# Patient Record
Sex: Female | Born: 2000 | Race: White | Hispanic: No | Marital: Single | Smoking: Never smoker
Health system: Southern US, Community
[De-identification: ages and names within clinical notes are randomized; demographics above are authoritative.]

## PROBLEM LIST (undated history)

## (undated) ENCOUNTER — Inpatient Hospital Stay: Payer: Self-pay

## (undated) DIAGNOSIS — K219 Gastro-esophageal reflux disease without esophagitis: Secondary | ICD-10-CM

## (undated) DIAGNOSIS — B2790 Infectious mononucleosis, unspecified without complication: Secondary | ICD-10-CM

## (undated) DIAGNOSIS — T7840XA Allergy, unspecified, initial encounter: Secondary | ICD-10-CM

## (undated) HISTORY — PX: NO PAST SURGERIES: SHX2092

## (undated) HISTORY — DX: Infectious mononucleosis, unspecified without complication: B27.90

## (undated) HISTORY — DX: Allergy, unspecified, initial encounter: T78.40XA

## (undated) HISTORY — DX: Gastro-esophageal reflux disease without esophagitis: K21.9

## (undated) HISTORY — PX: WISDOM TOOTH EXTRACTION: SHX21

---
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
## 2021-04-23 ENCOUNTER — Ambulatory Visit: Payer: No Typology Code available for payment source | Admitting: Family Medicine

## 2021-04-23 ENCOUNTER — Encounter: Payer: Self-pay | Admitting: Family Medicine

## 2021-04-23 ENCOUNTER — Other Ambulatory Visit: Payer: Self-pay

## 2021-04-23 VITALS — BP 123/86 | HR 88 | Temp 98.5°F | Wt 143.2 lb

## 2021-04-23 DIAGNOSIS — N3001 Acute cystitis with hematuria: Secondary | ICD-10-CM | POA: Diagnosis not present

## 2021-04-23 DIAGNOSIS — S134XXA Sprain of ligaments of cervical spine, initial encounter: Secondary | ICD-10-CM

## 2021-04-23 DIAGNOSIS — R300 Dysuria: Secondary | ICD-10-CM

## 2021-04-23 LAB — URINALYSIS, ROUTINE W REFLEX MICROSCOPIC
Bilirubin, UA: NEGATIVE
Glucose, UA: NEGATIVE
Ketones, UA: NEGATIVE
Nitrite, UA: POSITIVE — AB
Specific Gravity, UA: 1.02 (ref 1.005–1.030)
Urobilinogen, Ur: 0.2 mg/dL (ref 0.2–1.0)
pH, UA: 8 — ABNORMAL HIGH (ref 5.0–7.5)

## 2021-04-23 LAB — MICROSCOPIC EXAMINATION

## 2021-04-23 MED ORDER — CYCLOBENZAPRINE HCL 10 MG PO TABS: 10.0000 mg | ORAL_TABLET | Freq: Every day | ORAL | 0 refills | Status: DC

## 2021-04-23 MED ORDER — CIPROFLOXACIN HCL 500 MG PO TABS: 500.0000 mg | ORAL_TABLET | Freq: Two times a day (BID) | ORAL | 0 refills | Status: DC
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
## 2021-04-25 LAB — GC/CHLAMYDIA PROBE AMP
Chlamydia trachomatis, NAA: NEGATIVE
Neisseria Gonorrhoeae by PCR: NEGATIVE

## 2021-04-26 LAB — URINE CULTURE

## 2021-06-05 ENCOUNTER — Ambulatory Visit (INDEPENDENT_AMBULATORY_CARE_PROVIDER_SITE_OTHER): Payer: No Typology Code available for payment source | Admitting: Unknown Physician Specialty

## 2021-06-05 ENCOUNTER — Other Ambulatory Visit: Payer: Self-pay

## 2021-06-05 ENCOUNTER — Encounter: Payer: Self-pay | Admitting: Unknown Physician Specialty

## 2021-06-05 VITALS — BP 136/77 | HR 95 | Temp 98.7°F | Wt 144.4 lb

## 2021-06-05 DIAGNOSIS — N912 Amenorrhea, unspecified: Secondary | ICD-10-CM

## 2021-06-05 DIAGNOSIS — Z3201 Encounter for pregnancy test, result positive: Secondary | ICD-10-CM | POA: Diagnosis not present

## 2021-06-05 LAB — PREGNANCY, URINE: Preg Test, Ur: POSITIVE — AB

## 2021-06-05 NOTE — Progress Notes (Signed)
? ?BP 136/77   Pulse 95   Temp 98.7 ?F (37.1 ?C) (Oral)   Wt 144 lb 6.4 oz (65.5 kg)   LMP 02/26/2021 (Approximate)   SpO2 98%   BMI 24.03 kg/m?   ? ?Subjective:  ? ? Patient ID: Faith [name], female    DOB: April 07, 2000, 21 y.o.   MRN: [id number] ? ?HPI: ?Faith [name] is a 21 y.o. female ? ?Chief Complaint  ?Patient presents with  ? Amenorrhea  ?  Pt states she has not hade a period since December. States she took a home pregnancy test yesterday that showed positive.   ? ?Pt is here to confirm a home pregnancy test.  LMP was in December, but notes she has irregular periods.  Pt is having nausea in the AM.   ? ?Relevant past medical, surgical, family and social history reviewed and updated as indicated. Interim medical history since our last visit reviewed. ?Allergies and medications reviewed and updated. ? ?Review of Systems  ?Constitutional: Negative.   ?HENT: Negative.    ?Respiratory: Negative.    ?Cardiovascular: Negative.   ?Genitourinary: Negative.   ?Neurological: Negative.   ? ?Per HPI unless specifically indicated above ? ?   ?Objective:  ?  ?BP 136/77   Pulse 95   Temp 98.7 ?F (37.1 ?C) (Oral)   Wt 144 lb 6.4 oz (65.5 kg)   LMP 02/26/2021 (Approximate)   SpO2 98%   BMI 24.03 kg/m?   ?Wt Readings from Last 3 Encounters:  ?06/05/21 144 lb 6.4 oz (65.5 kg) (75 %, Z= 0.68)*  ?04/23/21 143 lb 3.2 oz (65 kg) (74 %, Z= 0.65)*  ?09/14/20 127 lb 6.4 oz (57.8 kg) (53 %, Z= 0.08)*  ? ?* Growth percentiles are based on CDC (Girls, 2-20 Years) data.  ?  ?Physical Exam ?Constitutional:   ?   General: She is not in acute distress. ?   Appearance: Normal appearance. She is well-developed.  ?HENT:  ?   Head: Normocephalic and atraumatic.  ?Eyes:  ?   General: Lids are normal. No scleral icterus.    ?   Right eye: No discharge.     ?   Left eye: No discharge.  ?   Conjunctiva/sclera: Conjunctivae normal.  ?Cardiovascular:  ?   Rate and Rhythm: Normal rate.  ?Pulmonary:  ?   Effort: Pulmonary effort is normal.   ?Abdominal:  ?   Palpations: There is no hepatomegaly or splenomegaly.  ?Musculoskeletal:     ?   General: Normal range of motion.  ?Skin: ?   Coloration: Skin is not pale.  ?   Findings: No rash.  ?Neurological:  ?   Mental Status: She is alert and oriented to person, place, and time.  ?Psychiatric:     ?   Behavior: Behavior normal.     ?   Thought Content: Thought content normal.     ?   Judgment: Judgment normal.  ? ? ?Results for orders placed or performed in visit on 04/23/21  ?GC/Chlamydia Probe Amp  ? Specimen: Urine  ? UR  ?Result Value Ref Range  ? Chlamydia trachomatis, NAA Negative Negative  ? Neisseria Gonorrhoeae by PCR Negative Negative  ?Microscopic Examination  ? Urine  ?Result Value Ref Range  ? WBC, UA 11-30 (A) 0 - 5 /hpf  ? RBC 0-2 0 - 2 /hpf  ? Epithelial Cells (non renal) 0-10 0 - 10 /hpf  ? Mucus, UA Present (A) Not Estab.  ? Bacteria, UA Many (A)  None seen/Few  ?Urine Culture  ? Specimen: Urine  ? UR  ?Result Value Ref Range  ? Urine Culture, Routine Final report (A)   ? Organism ID, Bacteria Escherichia coli (A)   ? Antimicrobial Susceptibility Comment   ?Urinalysis, Routine w reflex microscopic  ?Result Value Ref Range  ? Specific Gravity, UA 1.020 1.005 - 1.030  ? pH, UA 8.0 (H) 5.0 - 7.5  ? Color, UA Yellow Yellow  ? Appearance Ur Cloudy (A) Clear  ? Leukocytes,UA 3+ (A) Negative  ? Protein,UA 1+ (A) Negative/Trace  ? Glucose, UA Negative Negative  ? Ketones, UA Negative Negative  ? RBC, UA Trace (A) Negative  ? Bilirubin, UA Negative Negative  ? Urobilinogen, Ur 0.2 0.2 - 1.0 mg/dL  ? Nitrite, UA Positive (A) Negative  ? Microscopic Examination See below:   ? ?   ?Assessment & Plan:  ? ?Problem List Items Addressed This Visit   ?None ?Visit Diagnoses   ? ? Amenorrhea    -  Primary  ? Relevant Orders  ? Pregnancy, urine  ? Ambulatory referral to Obstetrics / Gynecology  ? Positive pregnancy test      ? 12 weeks.  Due date September 27.  Take a maternal multi vitamin.  Ginger for nausea.   Refer to gyn.  Avoid OTC meds  ? Relevant Orders  ? Ambulatory referral to Obstetrics / Gynecology  ? ?  ?  ? ?Follow up plan: ?F/u with gyn ? ? ? ? ? ?

## 2021-06-18 LAB — HM HIV SCREENING LAB: HM HIV Screening: NEGATIVE

## 2021-06-18 LAB — HIV ANTIBODY (ROUTINE TESTING W REFLEX): HIV 1&2 Ab, 4th Generation: NEGATIVE

## 2021-06-18 LAB — OB RESULTS CONSOLE GC/CHLAMYDIA: Chlamydia: NEGATIVE

## 2021-06-18 LAB — HM HEPATITIS C SCREENING LAB: HM Hepatitis Screen: NEGATIVE

## 2021-09-10 ENCOUNTER — Ambulatory Visit
Admission: EM | Admit: 2021-09-10 | Discharge: 2021-09-10 | Disposition: A | Discharge status: Home / Self Care | Payer: No Typology Code available for payment source

## 2021-09-10 DIAGNOSIS — L559 Sunburn, unspecified: Secondary | ICD-10-CM

## 2021-09-10 NOTE — ED Triage Notes (Signed)
Patient present top UC for sunburn -- 30 hours.   Patient is pregnant and the sunburn is causing her to have chills.

## 2021-09-15 ENCOUNTER — Other Ambulatory Visit: Payer: Self-pay | Admitting: Family Medicine

## 2021-09-17 ENCOUNTER — Ambulatory Visit (INDEPENDENT_AMBULATORY_CARE_PROVIDER_SITE_OTHER): Payer: No Typology Code available for payment source | Admitting: Family Medicine

## 2021-09-17 ENCOUNTER — Encounter: Payer: Self-pay | Admitting: Family Medicine

## 2021-09-17 VITALS — BP 128/83 | HR 95 | Temp 97.7°F | Ht 64.02 in | Wt 155.8 lb

## 2021-09-17 DIAGNOSIS — K219 Gastro-esophageal reflux disease without esophagitis: Secondary | ICD-10-CM | POA: Diagnosis not present

## 2021-09-17 DIAGNOSIS — Z0000 Encounter for general adult medical examination without abnormal findings: Secondary | ICD-10-CM

## 2021-09-17 NOTE — Progress Notes (Signed)
BP 128/83   Pulse 95   Temp 97.7 F (36.5 C) (Oral)   Ht 5' 4.02" (1.626 m)   Wt 155 lb 12.8 oz (70.7 kg)   LMP 02/26/2021 (Approximate)   SpO2 100%   BMI 26.73 kg/m    Subjective:    Patient ID: Faith [name], female    DOB: May 14, 2000, 21 y.o.   MRN: [id number]  HPI: Karinda Cabriales is a 21 y.o. female presenting on 09/17/2021 for comprehensive medical examination. Current medical complaints include:currently pregnant and having issues with GERD.  Menopausal Symptoms: no  Depression Screen done today and results listed below:     09/17/2021    1:14 PM 04/23/2021    3:06 PM 09/14/2020    9:06 AM 03/06/2020    8:46 AM 01/28/2018    9:26 AM  Depression screen PHQ 2/9  Decreased Interest 1 1 0 0 0  Down, Depressed, Hopeless 0 1 0 0 0  PHQ - 2 Score 1 2 0 0 0  Altered sleeping 1 1   0  Tired, decreased energy 2 1   0  Change in appetite 2 2   0  Feeling bad or failure about yourself  1 0   0  Trouble concentrating 0 1   0  Moving slowly or fidgety/restless 0 1   0  Suicidal thoughts 0    0  PHQ-9 Score 7 8   0  Difficult doing work/chores Somewhat difficult    Not difficult at all    Past Medical History:  Past Medical History:  Diagnosis Date   Allergy    Mononucleosis 21yo    Surgical History:  Past Surgical History:  Procedure Laterality Date   NO PAST SURGERIES      Medications:  Current Outpatient Medications on File Prior to Visit  Medication Sig   cetirizine (ZYRTEC) 10 MG tablet Take 1 tablet (10 mg total) by mouth daily.   ondansetron (ZOFRAN ODT) 8 MG disintegrating tablet Take 1 tablet (8 mg total) by mouth every 8 (eight) hours as needed.   Prenatal Vit-Fe Fumarate-FA (PRENATAL 1+1 PO) Take 1 tablet by mouth daily.   No current facility-administered medications on file prior to visit.    Allergies:  Allergies  Allergen Reactions   Orange Fruit [Citrus] Anaphylaxis    Social History:  Social History   Socioeconomic History   Marital  status: Single    Spouse name: Not on file   Number of children: Not on file   Years of education: Not on file   Highest education level: Not on file  Occupational History   Not on file  Tobacco Use   Smoking status: Never   Smokeless tobacco: Never  Vaping Use   Vaping Use: Some days  Substance and Sexual Activity   Alcohol use: No    Alcohol/week: 0.0 standard drinks of alcohol   Drug use: No   Sexual activity: Yes    Birth control/protection: Condom  Other Topics Concern   Not on file  Social History Narrative   Not on file   Social Determinants of Health   Financial Resource Strain: Not on file  Food Insecurity: Not on file  Transportation Needs: Not on file  Physical Activity: Not on file  Stress: Not on file  Social Connections: Not on file  Intimate Partner Violence: Not on file   Social History   Tobacco Use  Smoking Status Never  Smokeless Tobacco Never   Social History  Substance and Sexual Activity  Alcohol Use No   Alcohol/week: 0.0 standard drinks of alcohol    Family History:  Family History  Problem Relation Age of Onset   Cancer Mother        Hodgkin's Lymphoma   Hypertension Maternal Grandmother    Hypertension Paternal Grandmother    Cancer Paternal Grandmother    Cancer Paternal Grandfather        Lung    Past medical history, surgical history, medications, allergies, family history and social history reviewed with patient today and changes made to appropriate areas of the chart.   Review of Systems  Constitutional: Negative.   HENT: Negative.    Eyes: Negative.   Respiratory: Negative.    Cardiovascular: Negative.   Gastrointestinal:  Positive for abdominal pain, heartburn, nausea and vomiting. Negative for blood in stool, constipation, diarrhea and melena.  Genitourinary: Negative.   Musculoskeletal: Negative.   Skin: Negative.   Neurological: Negative.   Endo/Heme/Allergies:  Positive for environmental allergies and  polydipsia. Does not bruise/bleed easily.  Psychiatric/Behavioral: Negative.     All other ROS negative except what is listed above and in the HPI.      Objective:    BP 128/83   Pulse 95   Temp 97.7 F (36.5 C) (Oral)   Ht 5' 4.02" (1.626 m)   Wt 155 lb 12.8 oz (70.7 kg)   LMP 02/26/2021 (Approximate)   SpO2 100%   BMI 26.73 kg/m   Wt Readings from Last 3 Encounters:  09/17/21 155 lb 12.8 oz (70.7 kg) (85 %, Z= 1.02)*  09/10/21 154 lb (69.9 kg) (83 %, Z= 0.97)*  06/05/21 144 lb 6.4 oz (65.5 kg) (75 %, Z= 0.68)*   * Growth percentiles are based on CDC (Girls, 2-20 Years) data.    Physical Exam Vitals and nursing note reviewed.  Constitutional:      General: She is not in acute distress.    Appearance: Normal appearance. She is not ill-appearing, toxic-appearing or diaphoretic.  HENT:     Head: Normocephalic and atraumatic.     Right Ear: Tympanic membrane, ear canal and external ear normal. There is no impacted cerumen.     Left Ear: Tympanic membrane, ear canal and external ear normal. There is no impacted cerumen.     Nose: Nose normal. No congestion or rhinorrhea.     Mouth/Throat:     Mouth: Mucous membranes are moist.     Pharynx: Oropharynx is clear. No oropharyngeal exudate or posterior oropharyngeal erythema.  Eyes:     General: No scleral icterus.       Right eye: No discharge.        Left eye: No discharge.     Extraocular Movements: Extraocular movements intact.     Conjunctiva/sclera: Conjunctivae normal.     Pupils: Pupils are equal, round, and reactive to light.  Neck:     Vascular: No carotid bruit.  Cardiovascular:     Rate and Rhythm: Normal rate and regular rhythm.     Pulses: Normal pulses.     Heart sounds: No murmur heard.    No friction rub. No gallop.  Pulmonary:     Effort: Pulmonary effort is normal. No respiratory distress.     Breath sounds: Normal breath sounds. No stridor. No wheezing, rhonchi or rales.  Chest:     Chest wall: No  tenderness.  Abdominal:     General: Bowel sounds are normal. There is no distension.  Palpations: Abdomen is soft. There is no mass.     Tenderness: There is no abdominal tenderness. There is no right CVA tenderness, left CVA tenderness, guarding or rebound.     Hernia: No hernia is present.     Comments: Gravid  Genitourinary:    Comments: Breast and pelvic exams deferred with shared decision making Musculoskeletal:        General: No swelling, tenderness, deformity or signs of injury.     Cervical back: Normal range of motion and neck supple. No rigidity. No muscular tenderness.     Right lower leg: No edema.     Left lower leg: No edema.  Lymphadenopathy:     Cervical: No cervical adenopathy.  Skin:    General: Skin is warm and dry.     Capillary Refill: Capillary refill takes less than 2 seconds.     Coloration: Skin is not jaundiced or pale.     Findings: No bruising, erythema, lesion or rash.  Neurological:     General: No focal deficit present.     Mental Status: She is alert and oriented to person, place, and time. Mental status is at baseline.     Cranial Nerves: No cranial nerve deficit.     Sensory: No sensory deficit.     Motor: No weakness.     Coordination: Coordination normal.     Gait: Gait normal.     Deep Tendon Reflexes: Reflexes normal.  Psychiatric:        Mood and Affect: Mood normal.        Behavior: Behavior normal.        Thought Content: Thought content normal.        Judgment: Judgment normal.     Results for orders placed or performed in visit on 09/17/21  OB RESULTS CONSOLE GC/Chlamydia  Result Value Ref Range   Chlamydia Negative   HIV Antibody (routine testing w rflx)  Result Value Ref Range   HIV 1&2 Ab, 4th Generation negative   HM HIV SCREENING LAB  Result Value Ref Range   HM HIV Screening Negative - Validated   HM HEPATITIS C SCREENING LAB  Result Value Ref Range   HM Hepatitis Screen Negative-Validated       Assessment &  Plan:   Problem List Items Addressed This Visit   None Visit Diagnoses     Routine general medical examination at a health care facility    -  Primary   Vaccines up to date. Will hold on labs today given pregnancy. Continue diet and exercise. Call with any concerns. Continue to monitor.   Gastroesophageal reflux disease, unspecified whether esophagitis present       Information about GERD in pregnency given. Will work on dietary changes. Call with any concerns.         Follow up plan: Return in about 6 months (around 03/20/2022).   LABORATORY TESTING:  - Pap smear: not applicable  IMMUNIZATIONS:   - Tdap: Tetanus vaccination status reviewed: will get through GYN. - Influenza: Postponed to flu season - Pneumovax: Not applicable - Prevnar: Not applicable - COVID: Up to date  PATIENT COUNSELING:   Advised to take 1 mg of folate supplement per day if capable of pregnancy.   Sexuality: Discussed sexually transmitted diseases, partner selection, use of condoms, avoidance of unintended pregnancy  and contraceptive alternatives.   Advised to avoid cigarette smoking.  I discussed with the patient that most people either abstain from alcohol or drink within  safe limits (<=14/week and <=4 drinks/occasion for males, <=7/weeks and <= 3 drinks/occasion for females) and that the risk for alcohol disorders and other health effects rises proportionally with the number of drinks per week and how often a drinker exceeds daily limits.  Discussed cessation/primary prevention of drug use and availability of treatment for abuse.   Diet: Encouraged to adjust caloric intake to maintain  or achieve ideal body weight, to reduce intake of dietary saturated fat and total fat, to limit sodium intake by avoiding high sodium foods and not adding table salt, and to maintain adequate dietary potassium and calcium preferably from fresh fruits, vegetables, and low-fat dairy products.    stressed the importance of  regular exercise  Injury prevention: Discussed safety belts, safety helmets, smoke detector, smoking near bedding or upholstery.   Dental health: Discussed importance of regular tooth brushing, flossing, and dental visits.    NEXT PREVENTATIVE PHYSICAL DUE IN 1 YEAR. Return in about 6 months (around 03/20/2022).

## 2021-09-17 NOTE — Telephone Encounter (Signed)
Requested medication (s) are due for refill today:   Provider to review  Requested medication (s) are on the active medication list:   No  Future visit scheduled:   Yes   Was seen today by Dr. Laural Benes   Last ordered: Discontinued 09/17/2021  Non delegated refill    Requested Prescriptions  Pending Prescriptions Disp Refills   cyclobenzaprine (FLEXERIL) 10 MG tablet [Pharmacy Med Name: CYCLOBENZAPRINE 10 MG TABLET] 30 tablet 0    Sig: TAKE 1 TABLET BY MOUTH EVERYDAY AT BEDTIME     Not Delegated - Analgesics:  Muscle Relaxants Failed - 09/15/2021  5:23 PM      Failed - This refill cannot be delegated      Passed - Valid encounter within last 6 months    Recent Outpatient Visits           Today Routine general medical examination at a health care facility   [facility], Megan P, DO   3 months ago Amenorrhea   [facility] Gabriel Cirri, NP   4 months ago Acute cystitis with hematuria   [facility] [place], Megan P, DO   1 year ago Routine general medical examination at a health care facility   [facility] [place], [place], DO   1 year ago Vertigo   [facility] Caro Laroche, DO       Future Appointments             In 6 months Laural Benes, Oralia Rud, DO [facility]

## 2021-11-25 DIAGNOSIS — O1490 Unspecified pre-eclampsia, unspecified trimester: Secondary | ICD-10-CM

## 2022-03-21 ENCOUNTER — Ambulatory Visit (INDEPENDENT_AMBULATORY_CARE_PROVIDER_SITE_OTHER): Payer: BC Managed Care – PPO | Admitting: Family Medicine

## 2022-03-21 ENCOUNTER — Encounter: Payer: Self-pay | Admitting: Family Medicine

## 2022-03-21 VITALS — BP 110/76 | HR 79 | Temp 98.1°F | Ht 64.0 in | Wt 150.0 lb

## 2022-03-21 DIAGNOSIS — Z8759 Personal history of other complications of pregnancy, childbirth and the puerperium: Secondary | ICD-10-CM | POA: Diagnosis not present

## 2022-03-21 DIAGNOSIS — K649 Unspecified hemorrhoids: Secondary | ICD-10-CM

## 2022-03-21 DIAGNOSIS — G43709 Chronic migraine without aura, not intractable, without status migrainosus: Secondary | ICD-10-CM

## 2022-03-21 MED ORDER — HYDROCORTISONE ACETATE 25 MG RE SUPP: 25.0000 mg | Freq: Two times a day (BID) | RECTAL | 6 refills | Status: DC

## 2022-03-21 NOTE — Progress Notes (Signed)
BP 110/76   Pulse 79   Temp 98.1 F (36.7 C) (Oral)   Ht 5\' 4"  (1.626 m)   Wt 150 lb (68 kg)   SpO2 98%   Breastfeeding Unknown   BMI 25.75 kg/m    Subjective:    Patient ID: Faith [name], female    DOB: October 24, 2000, 22 y.o.   MRN: [id number]  HPI: Faith [name] is a 22 y.o. female  Chief Complaint  Patient presents with   Migraine   Has been doing well off medicine for migraines. No concerns.   HEMORRHOIDS Duration: months Anal fullness: yes Perianal itching/irritation: yes Perianal pain: no Bright red rectal bleeding: yes Amount of blood: spotting Frequency: intermittent Constipation: no Hard stools: no Chronic straining/valsava: no Status: stable Treatments attempted: OTC medicated wipes  Previous hemorrhoids: no  Colonoscopy: no  Relevant past medical, surgical, family and social history reviewed and updated as indicated. Interim medical history since our last visit reviewed. Allergies and medications reviewed and updated.  Review of Systems  Constitutional: Negative.   Respiratory: Negative.    Cardiovascular: Negative.   Gastrointestinal: Negative.   Musculoskeletal: Negative.   Neurological: Negative.   Psychiatric/Behavioral: Negative.      Per HPI unless specifically indicated above     Objective:    BP 110/76   Pulse 79   Temp 98.1 F (36.7 C) (Oral)   Ht 5\' 4"  (1.626 m)   Wt 150 lb (68 kg)   SpO2 98%   Breastfeeding Unknown   BMI 25.75 kg/m   Wt Readings from Last 3 Encounters:  03/21/22 150 lb (68 kg)  09/17/21 155 lb 12.8 oz (70.7 kg) (85 %, Z= 1.02)*  09/10/21 154 lb (69.9 kg) (83 %, Z= 0.97)*   * Growth percentiles are based on CDC (Girls, 2-20 Years) data.    Physical Exam Vitals and nursing note reviewed.  Constitutional:      General: She is not in acute distress.    Appearance: Normal appearance. She is normal weight. She is not ill-appearing, toxic-appearing or diaphoretic.  HENT:     Head: Normocephalic and  atraumatic.     Right Ear: External ear normal.     Left Ear: External ear normal.     Nose: Nose normal.     Mouth/Throat:     Mouth: Mucous membranes are moist.     Pharynx: Oropharynx is clear.  Eyes:     General: No scleral icterus.       Right eye: No discharge.        Left eye: No discharge.     Extraocular Movements: Extraocular movements intact.     Conjunctiva/sclera: Conjunctivae normal.     Pupils: Pupils are equal, round, and reactive to light.  Cardiovascular:     Rate and Rhythm: Normal rate and regular rhythm.     Pulses: Normal pulses.     Heart sounds: Normal heart sounds. No murmur heard.    No friction rub. No gallop.  Pulmonary:     Effort: Pulmonary effort is normal. No respiratory distress.     Breath sounds: Normal breath sounds. No stridor. No wheezing, rhonchi or rales.  Chest:     Chest wall: No tenderness.  Musculoskeletal:        General: Normal range of motion.     Cervical back: Normal range of motion and neck supple.  Skin:    General: Skin is warm and dry.     Capillary Refill: Capillary refill takes less  than 2 seconds.     Coloration: Skin is not jaundiced or pale.     Findings: No bruising, erythema, lesion or rash.  Neurological:     General: No focal deficit present.     Mental Status: She is alert and oriented to person, place, and time. Mental status is at baseline.  Psychiatric:        Mood and Affect: Mood normal.        Behavior: Behavior normal.        Thought Content: Thought content normal.        Judgment: Judgment normal.     Results for orders placed or performed in visit on 09/17/21  OB RESULTS CONSOLE GC/Chlamydia  Result Value Ref Range   Chlamydia Negative   HIV Antibody (routine testing w rflx)  Result Value Ref Range   HIV 1&2 Ab, 4th Generation negative   HM HIV SCREENING LAB  Result Value Ref Range   HM HIV Screening Negative - Validated   HM HEPATITIS C SCREENING LAB  Result Value Ref Range   HM Hepatitis  Screen Negative-Validated       Assessment & Plan:   Problem List Items Addressed This Visit       Cardiovascular and Mediastinum   Chronic migraine without aura without status migrainosus, not intractable    Doing well off medicine. Continue to monitor. Call with any concerns.         Other   History of pre-eclampsia    BP under good control. Continue to monitor. Call with any concerns.      Other Visit Diagnoses     Hemorrhoids, unspecified hemorrhoid type    -  Primary   Will treat with anusol. Call if not getting better or getting worse.        Follow up plan: Return in about 6 months (around 09/19/2022) for physical.

## 2022-03-21 NOTE — Assessment & Plan Note (Signed)
Doing well off medicine. Continue to monitor. Call with any concerns.  

## 2022-03-21 NOTE — Assessment & Plan Note (Signed)
BP under good control. Continue to monitor. Call with any concerns.  

## 2022-04-10 ENCOUNTER — Encounter: Payer: Self-pay | Admitting: Family Medicine

## 2022-05-11 DIAGNOSIS — R1110 Vomiting, unspecified: Secondary | ICD-10-CM | POA: Diagnosis not present

## 2022-05-12 DIAGNOSIS — R000 Tachycardia, unspecified: Secondary | ICD-10-CM | POA: Diagnosis not present

## 2022-05-14 ENCOUNTER — Telehealth: Payer: Self-pay

## 2022-05-14 NOTE — Transitions of Care (Post Inpatient/ED Visit) (Signed)
   05/14/2022  Name: Faith [name] MRN: [id number] DOB: May 05, 2000  Today's TOC FU Call Status: Today's TOC FU Call Status:: Successful TOC FU Call Competed TOC FU Call Complete Date: 05/14/22  Transition Care Management Follow-up Telephone Call Date of Discharge: 05/11/22 Discharge Facility: Other ([place]) Name of Other ([place]) Discharge Facility: Patient says she left before being seen as she waited for 3 hours. Type of Discharge: Emergency Department Reason for ED Visit: Other: How have you been since you were released from the hospital?: Better Any questions or concerns?: No  Items Reviewed: Did you receive and understand the discharge instructions provided?: No Medications obtained and verified?: No Any new allergies since your discharge?: No Dietary orders reviewed?: NA Do you have support at home?: No  Home Care and Equipment/Supplies: [place] Ordered?: No Any new equipment or medical supplies ordered?: No  Functional Questionnaire: Do you need assistance with bathing/showering or dressing?: No Do you need assistance with meal preparation?: No Do you need assistance with eating?: No Do you have difficulty maintaining continence: No Do you need assistance with getting out of bed/getting out of a chair/moving?: No Do you have difficulty managing or taking your medications?: No  Folllow up appointments reviewed: PCP Follow-up appointment confirmed?: No MD Provider Line Number:[phone] Given: Yes [facility] Follow-up appointment confirmed?: NA Do you need transportation to your follow-up appointment?: No Do you understand care options if your condition(s) worsen?: Yes-patient verbalized understanding    [place], [place]

## 2022-05-14 NOTE — Telephone Encounter (Signed)
-----   Message from Georgina Peer, [place] sent at 05/14/2022  9:29 AM EST ----- Needs TOC call completed please.

## 2022-06-11 ENCOUNTER — Ambulatory Visit: Payer: BC Managed Care – PPO | Admitting: Unknown Physician Specialty

## 2022-06-11 ENCOUNTER — Encounter: Payer: Self-pay | Admitting: Unknown Physician Specialty

## 2022-06-11 ENCOUNTER — Emergency Department
Admission: EM | Admit: 2022-06-11 | Discharge: 2022-06-11 | Disposition: A | Discharge status: Home / Self Care | Payer: BC Managed Care – PPO | Attending: Student in an Organized Health Care Education/Training Program | Admitting: Student in an Organized Health Care Education/Training Program

## 2022-06-11 VITALS — BP 123/75 | HR 76 | Temp 99.6°F | Wt 148.2 lb

## 2022-06-11 DIAGNOSIS — R1111 Vomiting without nausea: Secondary | ICD-10-CM | POA: Diagnosis not present

## 2022-06-11 DIAGNOSIS — R0602 Shortness of breath: Secondary | ICD-10-CM | POA: Diagnosis not present

## 2022-06-11 DIAGNOSIS — T7840XA Allergy, unspecified, initial encounter: Secondary | ICD-10-CM | POA: Diagnosis not present

## 2022-06-11 DIAGNOSIS — Z3009 Encounter for other general counseling and advice on contraception: Secondary | ICD-10-CM

## 2022-06-11 DIAGNOSIS — T782XXA Anaphylactic shock, unspecified, initial encounter: Secondary | ICD-10-CM | POA: Diagnosis not present

## 2022-06-11 DIAGNOSIS — R21 Rash and other nonspecific skin eruption: Secondary | ICD-10-CM | POA: Diagnosis not present

## 2022-06-11 LAB — URINALYSIS, ROUTINE W REFLEX MICROSCOPIC
Bacteria, UA: NONE SEEN
Bilirubin Urine: NEGATIVE
Glucose, UA: NEGATIVE mg/dL
Ketones, ur: NEGATIVE mg/dL
Leukocytes,Ua: NEGATIVE
Nitrite: NEGATIVE
Protein, ur: 30 mg/dL — AB
Specific Gravity, Urine: 1.009 (ref 1.005–1.030)
pH: 6 (ref 5.0–8.0)

## 2022-06-11 LAB — COMPREHENSIVE METABOLIC PANEL
ALT: 20 U/L (ref 0–44)
AST: 22 U/L (ref 15–41)
Albumin: 3.6 g/dL (ref 3.5–5.0)
Alkaline Phosphatase: 78 U/L (ref 38–126)
Anion gap: 13 (ref 5–15)
BUN: 11 mg/dL (ref 6–20)
CO2: 23 mmol/L (ref 22–32)
Calcium: 8.5 mg/dL — ABNORMAL LOW (ref 8.9–10.3)
Chloride: 104 mmol/L (ref 98–111)
Creatinine, Ser: 0.66 mg/dL (ref 0.44–1.00)
GFR, Estimated: >60 mL/min (ref >60)
Glucose, Bld: 105 mg/dL — ABNORMAL HIGH (ref 70–99)
Potassium: 3 mmol/L — ABNORMAL LOW (ref 3.5–5.1)
Sodium: 140 mmol/L (ref 135–145)
Total Bilirubin: 0.9 mg/dL (ref 0.3–1.2)
Total Protein: 6.8 g/dL (ref 6.5–8.1)

## 2022-06-11 LAB — CBC
HCT: 44.6 % (ref 36.0–46.0)
Hemoglobin: 14 g/dL (ref 12.0–15.0)
MCH: 26.8 pg (ref 26.0–34.0)
MCHC: 31.4 g/dL (ref 30.0–36.0)
MCV: 85.4 fL (ref 80.0–100.0)
Platelets: 260 10*3/uL (ref 150–400)
RBC: 5.22 MIL/uL — ABNORMAL HIGH (ref 3.87–5.11)
RDW: 14.4 % (ref 11.5–15.5)
WBC: 7.5 10*3/uL (ref 4.0–10.5)
nRBC: 0 % (ref 0.0–0.2)

## 2022-06-11 LAB — PREGNANCY, URINE: Preg Test, Ur: NEGATIVE

## 2022-06-11 LAB — POC URINE PREG, ED: Preg Test, Ur: NEGATIVE

## 2022-06-11 MED ORDER — POTASSIUM CHLORIDE CRYS ER 20 MEQ PO TBCR
40.0000 meq | EXTENDED_RELEASE_TABLET | Freq: Once | ORAL | Status: AC
  Administered 2022-06-11: 40 meq via ORAL
  Filled 2022-06-11: qty 2

## 2022-06-11 MED ORDER — EPINEPHRINE 0.3 MG/0.3ML IJ SOAJ: 0.3000 mg | INTRAMUSCULAR | 1 refills | Status: DC | PRN

## 2022-06-11 MED ORDER — MEDROXYPROGESTERONE ACETATE 150 MG/ML IM SUSP
150.0000 mg | INTRAMUSCULAR | Status: AC
  Administered 2022-06-11: 150 mg via INTRAMUSCULAR

## 2022-06-11 MED ORDER — METHYLPREDNISOLONE SODIUM SUCC 40 MG IJ SOLR
40.0000 mg | Freq: Once | INTRAMUSCULAR | Status: AC
  Administered 2022-06-11: 40 mg via INTRAMUSCULAR

## 2022-06-11 NOTE — Progress Notes (Addendum)
BP 123/75   Pulse 76   Temp 99.6 F (37.6 C) (Oral)   Wt 148 lb 3.2 oz (67.2 kg)   LMP 06/07/2022   SpO2 98%   Breastfeeding No   BMI 25.44 kg/m    Subjective:    Patient ID: Faith [name], female    DOB: 2001-02-23, 22 y.o.   MRN: [id number]  HPI: Faith [name] is a 22 y.o. female  Chief Complaint  Patient presents with   Contraception    Pt states she is here to discuss birth control options. States she is currently on the pill, but would like to discuss switching to the implant or an IUD   Pt is here to explore [facility] options besides the pill as she is irregular at taking it.  Admits that her periods are also irregular but denies side effects.  Has a 40 month old and does not want to conceive anytime soon.  States she is on her period today.    We discussed all options: BCPs with an alarm, Nuva-ring, patch, depo provera, implant, IUD.  Pt chose Depo Provera.  Side effects of weight gain, irregular menses, bone density loss discussed.     Relevant past medical, surgical, family and social history reviewed and updated as indicated. Interim medical history since our last visit reviewed. Allergies and medications reviewed and updated.   Per HPI unless specifically indicated above     Objective:    BP 123/75   Pulse 76   Temp 99.6 F (37.6 C) (Oral)   Wt 148 lb 3.2 oz (67.2 kg)   LMP 06/07/2022   SpO2 98%   Breastfeeding No   BMI 25.44 kg/m   Wt Readings from Last 3 Encounters:  06/11/22 148 lb 3.2 oz (67.2 kg)  03/21/22 150 lb (68 kg)  09/17/21 155 lb 12.8 oz (70.7 kg) (85 %, Z= 1.02)*   * Growth percentiles are based on CDC (Girls, 2-20 Years) data.    Physical Exam Constitutional:      General: She is not in acute distress.    Appearance: Normal appearance. She is well-developed.  HENT:     Head: Normocephalic and atraumatic.  Eyes:     General: Lids are normal. No scleral icterus.       Right eye: No discharge.        Left eye: No discharge.      Conjunctiva/sclera: Conjunctivae normal.  Cardiovascular:     Rate and Rhythm: Normal rate.  Pulmonary:     Effort: Pulmonary effort is normal.  Abdominal:     Palpations: There is no hepatomegaly or splenomegaly.  Musculoskeletal:        General: Normal range of motion.  Skin:    Coloration: Skin is not pale.     Findings: No rash.  Neurological:     Mental Status: She is alert and oriented to person, place, and time.  Psychiatric:        Behavior: Behavior normal.        Thought Content: Thought content normal.        Judgment: Judgment normal.     Results for orders placed or performed in visit on 09/17/21  OB RESULTS CONSOLE GC/Chlamydia  Result Value Ref Range   Chlamydia Negative   HIV Antibody (routine testing w rflx)  Result Value Ref Range   HIV 1&2 Ab, 4th Generation negative   HM HIV SCREENING LAB  Result Value Ref Range   HM HIV Screening Negative -  Validated   HM HEPATITIS C SCREENING LAB  Result Value Ref Range   HM Hepatitis Screen Negative-Validated       Assessment & Plan:   Problem List Items Addressed This Visit       Unprioritized   Family planning - Primary    Check pregnancy urine which was negative.  Administer first injection today.  Pt understands she will need to be here every 12 weeks. For another injection.  Pt info given.        Relevant Medications   medroxyPROGESTERone (DEPO-PROVERA) injection 150 mg   Other Relevant Orders   Pregnancy, urine     Follow up plan: Return in about 3 months (around 09/11/2022) for nursing visit.  Addendum: Pt went out to the car and had sudden onset of SOB.  She was pale without rash.  Lungs are clear.  Pulse regular.  We had her lay down for about 1 hour, gave benadryl, 40 mg solu-medrol.  Pt vomiting in room and curled in a fetal position.  Had Dr. Wynetta Emery examine her who recommended the solu-medrol.  Pt with continuing heaviness in chest and nausea.  Called rescue to transport to the ER

## 2022-06-11 NOTE — ED Triage Notes (Signed)
First nurse note:Arrived by EMS from [facility]. Was there getting depo shot. While walking out to her car she became sob, hypotensive 90/60 b/p and near syncope.  EMS administered epi upon arrival 10:31am, 50mg  benadryl Iv, 125mg  solunmedrol, 4mg  zofran, and 20mg  pepcid.   A&O X4 upon arrival  EMS vitals: 120/80s b/p 99% RA

## 2022-06-11 NOTE — Assessment & Plan Note (Signed)
Check pregnancy urine which was negative.  Administer first injection today.  Pt understands she will need to be here every 12 weeks. For another injection.  Pt info given.

## 2022-06-11 NOTE — Addendum Note (Signed)
Addended by: Kathrine Haddock on: 06/11/2022 10:48 AM   Modules accepted: Orders, Level of Service

## 2022-06-11 NOTE — ED Provider Notes (Signed)
[facility] Provider Note    Event Date/Time   First MD Initiated Contact with Patient 06/11/22 1120     (approximate)   History   Allergic Reaction   HPI  Faith [name] is a 22 y.o. female presents via EMS for presumed allergic reaction after she received a Depo shot today.  She has had this before without any issues.  Has shot felt fine this morning at the injection and then as she was leaving clinic started feeling lightheaded with chest tightness and some nausea.  She went back in the clinic then became flushed red reportedly started developing rash.  EMS was called.  Her blood pressure was found to be low she was given EpiPen, Benadryl, Pepcid as well as Solu-Medrol.  She feels like her symptoms are improving.  No history of allergic reaction or anaphylaxis.     Physical Exam   Triage Vital Signs: ED Triage Vitals [06/11/22 1113]  Enc Vitals Group     BP (!) 125/91     Pulse Rate (!) 103     Resp 17     Temp 98.5 F (36.9 C)     Temp Source Oral     SpO2 100 %     Weight 148 lb (67.1 kg)     Height      Head Circumference      Peak Flow      Pain Score 7     Pain Loc      Pain Edu?      Excl. in [place]?     Most recent vital signs: Vitals:   06/11/22 1113 06/11/22 1127  BP: (!) 125/91 126/75  Pulse: (!) 103 93  Resp: 17 20  Temp: 98.5 F (36.9 C)   SpO2: 100% 98%     Constitutional: Alert  Eyes: Conjunctivae are normal.  Head: Atraumatic. Nose: No congestion/rhinnorhea. Mouth/Throat: Mucous membranes are moist.   Neck: Painless ROM.  Cardiovascular:   Good peripheral circulation. Respiratory: Normal respiratory effort.  No retractions.  Gastrointestinal: Soft and nontender.  Musculoskeletal:  no deformity Neurologic:  MAE spontaneously. No gross focal neurologic deficits are appreciated.  Skin:  Skin is warm, dry and intact. No rash noted. Psychiatric: Mood and affect are normal. Speech and behavior are normal.    ED  Results / Procedures / Treatments   Labs (all labs ordered are listed, but only abnormal results are displayed) Labs Reviewed  CBC - Abnormal; Notable for the following components:      Result Value   RBC 5.22 (*)    All other components within normal limits  COMPREHENSIVE METABOLIC PANEL - Abnormal; Notable for the following components:   Potassium 3.0 (*)    Glucose, Bld 105 (*)    Calcium 8.5 (*)    All other components within normal limits  URINALYSIS, ROUTINE W REFLEX MICROSCOPIC - Abnormal; Notable for the following components:   Color, Urine YELLOW (*)    APPearance HAZY (*)    Hgb urine dipstick MODERATE (*)    Protein, ur 30 (*)    All other components within normal limits  POC URINE PREG, ED     EKG     RADIOLOGY    PROCEDURES:  Critical Care performed:   Procedures   MEDICATIONS ORDERED IN ED: Medications  potassium chloride SA (KLOR-CON M) CR tablet 40 mEq (40 mEq Oral Given 06/11/22 1204)     IMPRESSION / MDM / ASSESSMENT AND PLAN / ED COURSE  I reviewed the triage vital signs and the nursing notes.                              Differential diagnosis includes, but is not limited to, anaphylaxis, allergic reaction, dehydration, electrolyte abnormality  Patient presenting to the ER for evaluation of symptoms as described above.  Based on symptoms, risk factors and considered above differential, this presenting complaint could reflect a potentially life-threatening illness therefore the patient will be placed on continuous pulse oximetry and telemetry for monitoring.  Laboratory evaluation will be sent to evaluate for the above complaints.    Clinical Course as of 06/11/22 1403  Tue Jun 11, 2022  1402 Patient observed in the ER 3-hour after injection she remains well-appearing no acute distress.  No signs of rebound anaphylaxis.  Asymptomatic and does appear stable and appropriate for outpatient follow-up at this point.  Discussed tricked return  precautions. [PR]    Clinical Course User Index [PR] Merlyn Lot, MD     FINAL CLINICAL IMPRESSION(S) / ED DIAGNOSES   Final diagnoses:  Allergic reaction, initial encounter     Rx / DC Orders   ED Discharge Orders          Ordered    EPINEPHrine 0.3 mg/0.3 mL IJ SOAJ injection  As needed        06/11/22 1402             Note:  This document was prepared using Dragon voice recognition software and may include unintentional dictation errors.    Merlyn Lot, MD 06/11/22 [phone]

## 2022-06-11 NOTE — ED Triage Notes (Signed)
Pt arrives via EMS. Per pt, she was at her MD office and received the Depo shot. Pt walked out of office and started to feel funny, pt than went into the office and they called 911.

## 2022-06-17 ENCOUNTER — Emergency Department
Admission: EM | Admit: 2022-06-17 | Discharge: 2022-06-18 | Disposition: A | Discharge status: Home / Self Care | Payer: BC Managed Care – PPO | Attending: Emergency Medicine | Admitting: Emergency Medicine

## 2022-06-17 DIAGNOSIS — R0789 Other chest pain: Secondary | ICD-10-CM | POA: Diagnosis not present

## 2022-06-17 DIAGNOSIS — T782XXA Anaphylactic shock, unspecified, initial encounter: Secondary | ICD-10-CM | POA: Insufficient documentation

## 2022-06-17 LAB — POC URINE PREG, ED: Preg Test, Ur: NEGATIVE

## 2022-06-17 MED ORDER — FAMOTIDINE IN NACL 20-0.9 MG/50ML-% IV SOLN
20.0000 mg | Freq: Once | INTRAVENOUS | Status: AC
  Administered 2022-06-17: 20 mg via INTRAVENOUS
  Filled 2022-06-17: qty 50

## 2022-06-17 MED ORDER — METHYLPREDNISOLONE SODIUM SUCC 40 MG IJ SOLR
40.0000 mg | Freq: Once | INTRAMUSCULAR | Status: AC
  Administered 2022-06-17: 40 mg via INTRAVENOUS
  Filled 2022-06-17: qty 1

## 2022-06-17 MED ORDER — EPINEPHRINE 0.3 MG/0.3ML IJ SOAJ: 0.3000 mg | INTRAMUSCULAR | 10 refills | Status: AC | PRN

## 2022-06-17 MED ORDER — SODIUM CHLORIDE 0.9 % IV SOLN: 40.0000 mg | Freq: Once | INTRAVENOUS | Status: DC

## 2022-06-17 NOTE — ED Provider Notes (Signed)
[facility] Provider Note    Event Date/Time   First MD Initiated Contact with Patient 06/17/22 2234     (approximate)   History   Allergic Reaction   HPI  Lameika Qasem is a 22 y.o. female with no stated past medical history who presents with concern for anaphylaxis.  Patient was eating fried chicken when she suddenly felt her chest was tight and warm and she was having some difficulty breathing.  She then had episode of vomiting and diarrhea and had what she thought was some hives on her ankles so she administered her EpiPen.  She did not eat anything today she does not have before.  Did have recent presentation for presumed anaphylaxis on 3/26 after receiving a Depo shot.  Currently she feels overall improved.  Does still endorse some chest tightness as well as some abdominal discomfort.  No difficulty swallowing no rash.    Past Medical History:  Diagnosis Date  . Allergy   . Mononucleosis 22yo    Patient Active Problem List   Diagnosis Date Noted  . Family planning 06/11/2022  . History of pre-eclampsia 03/21/2022  . Chronic migraine without aura without status migrainosus, not intractable 09/14/2020  . Vertigo 03/22/2020  . Menorrhagia with regular cycle 01/22/2017  . Hearing loss 01/22/2017  . Acne vulgaris 12/13/2015  . Allergy      Physical Exam  Triage Vital Signs: ED Triage Vitals  Enc Vitals Group     BP 06/17/22 2230 (!) 143/80     Pulse Rate 06/17/22 2230 (!) 102     Resp 06/17/22 2230 16     Temp 06/17/22 2230 98.7 F (37.1 C)     Temp Source 06/17/22 2230 Oral     SpO2 06/17/22 2230 100 %     Weight 06/17/22 2232 148 lb (67.1 kg)     Height 06/17/22 2232 5\' 4"  (1.626 m)     Head Circumference --      Peak Flow --      Pain Score --      Pain Loc --      Pain Edu? --      Excl. in [place]? --     Most recent vital signs: Vitals:   06/17/22 2230  BP: (!) 143/80  Pulse: (!) 102  Resp: 16  Temp: 98.7 F (37.1 C)   SpO2: 100%     General: Awake, no distress.  CV:  Good peripheral perfusion.  Resp:  Normal effort.  Lungs are clear without wheezing Abd:  No distention.  Abdomen is soft nontender throughout Neuro:             Awake, Alert, Oriented x 3  Other:  No urticaria or rash noted, no swelling of the lips or posterior oropharynx or tongue   ED Results / Procedures / Treatments  Labs (all labs ordered are listed, but only abnormal results are displayed) Labs Reviewed  POC URINE PREG, ED     EKG     RADIOLOGY    PROCEDURES:  Critical Care performed: No  Procedures   MEDICATIONS ORDERED IN ED: Medications  famotidine (PEPCID) IVPB 20 mg premix (20 mg Intravenous New Bag/Given 06/17/22 2314)    And  famotidine (PEPCID) IVPB 20 mg premix (20 mg Intravenous New Bag/Given 06/17/22 2310)  methylPREDNISolone sodium succinate (SOLU-MEDROL) 40 mg/mL injection 40 mg (40 mg Intravenous Given 06/17/22 2306)     IMPRESSION / MDM / ASSESSMENT AND PLAN / ED COURSE  I reviewed the triage vital signs and the nursing notes.                              Patient's presentation is most consistent with acute presentation with potential threat to life or bodily function.  Differential diagnosis includes, but is not limited to, anaphylaxis, allergic reaction, gastroenteritis  Patient is a 22 year old female who presents with concern for an allergic reaction.  She was eating chicken that she had in the past but then developed sensation of chest tightness difficulty breathing and had nausea vomiting diarrhea 1 episode and hives in her ankles.  She administered her EpiPen at 937.  She did have presentation concerning for anaphylaxis several days ago as well was seen in the ED this was after getting a Depo shot which is also had in the past.  Patient is mildly tachycardic and hypertensive on arrival.  She looks well there is no urticaria no lip tongue or posterior oropharyngeal swelling no rash noted  lungs are clear abdomen is benign.  With the dyspnea vomiting possible rash patient does meet criteria for anaphylaxis.  Difficult to fully rule this out without seeing symptoms.  She has ready received IM epinephrine and is symptomatically improved.  Will give Solu-Medrol and Pepcid has already received 50 of Benadryl with EMS.    I do think patient would benefit from seeing allergy as an outpatient given somewhat unclear what the trigger for this is.  Could just be idiopathic.   Clinical Course as of 06/17/22 2348  Mon Jun 17, 2022  2348 Preg Test, Ur: Negative [KM]    Clinical Course User Index [KM] Rada Hay, MD     FINAL CLINICAL IMPRESSION(S) / ED DIAGNOSES   Final diagnoses:  Anaphylaxis, initial encounter     Rx / DC Orders   ED Discharge Orders          Ordered    EPINEPHrine 0.3 mg/0.3 mL IJ SOAJ injection  As needed        06/17/22 2318             Note:  This document was prepared using Dragon voice recognition software and may include unintentional dictation errors.   Rada Hay, MD 06/17/22 [phone]

## 2022-06-17 NOTE — ED Triage Notes (Addendum)
Pt BIB EMS for allergic reaction. Patient was eating chicken that she has had in the past with no reaction and shortly after eating she started to have N/V/D, chest tightness. She self administered Epi pen L Thigh. EMS admin 50mg  Benadryl and 4mg  Zofran PTA. Pt reports rec Depo shot x2 days ago.

## 2022-06-18 ENCOUNTER — Encounter: Payer: Self-pay | Admitting: Family Medicine

## 2022-06-18 DIAGNOSIS — T782XXD Anaphylactic shock, unspecified, subsequent encounter: Secondary | ICD-10-CM

## 2022-06-18 NOTE — ED Provider Notes (Signed)
Patient received in signout from Dr. Starleen Blue pending 3-hour observation time after home EpiPen administration in the setting of anaphylaxis of uncertain etiology.  She is observed for about 3 and half hours without indications for repeat dosing of epinephrine or any complications.  She is suitable for outpatient management per original plan of care.  Discharged with EpiPen prescription   Vladimir Crofts, MD 06/18/22 [phone]

## 2022-06-19 ENCOUNTER — Telehealth: Payer: Self-pay

## 2022-06-19 NOTE — Telephone Encounter (Signed)
-----   Message from Georgina Peer, [place] sent at 06/19/2022 10:32 AM EDT ----- Patient needs TOC call completed

## 2022-06-19 NOTE — Transitions of Care (Post Inpatient/ED Visit) (Signed)
   06/19/2022  Name: Brinesha Terzo MRN: [id number] DOB: 12-Mar-2001  Today's TOC FU Call Status: Today's TOC FU Call Status:: Successful TOC FU Call Competed TOC FU Call Complete Date: 06/19/22  Transition Care Management Follow-up Telephone Call Date of Discharge: 06/17/22 Discharge Facility: [facility]) Type of Discharge: Emergency Department Reason for ED Visit: Other: How have you been since you were released from the hospital?: Better Any questions or concerns?: No  Items Reviewed: Did you receive and understand the discharge instructions provided?: Yes Medications obtained and verified?: Yes (Medications Reviewed) Any new allergies since your discharge?: No Dietary orders reviewed?: NA Do you have support at home?: Yes  Home Care and Equipment/Supplies: [place] Ordered?: NA Any new equipment or medical supplies ordered?: NA  Functional Questionnaire: Do you need assistance with bathing/showering or dressing?: No Do you need assistance with meal preparation?: No Do you need assistance with eating?: No Do you have difficulty maintaining continence: No Do you need assistance with getting out of bed/getting out of a chair/moving?: No Do you have difficulty managing or taking your medications?: No  Follow up appointments reviewed: PCP Follow-up appointment confirmed?: NA MD Provider Line Number:[phone] Given: No Specialist Hospital Follow-up appointment confirmed?: Yes Do you need transportation to your follow-up appointment?: No Do you understand care options if your condition(s) worsen?: Yes-patient verbalized understanding    [place], [place]

## 2022-08-07 ENCOUNTER — Encounter: Payer: Self-pay | Admitting: Family Medicine

## 2022-08-13 DIAGNOSIS — J3089 Other allergic rhinitis: Secondary | ICD-10-CM | POA: Diagnosis not present

## 2022-08-19 DIAGNOSIS — J301 Allergic rhinitis due to pollen: Secondary | ICD-10-CM | POA: Diagnosis not present

## 2022-08-20 DIAGNOSIS — J3081 Allergic rhinitis due to animal (cat) (dog) hair and dander: Secondary | ICD-10-CM | POA: Diagnosis not present

## 2022-08-20 DIAGNOSIS — J3089 Other allergic rhinitis: Secondary | ICD-10-CM | POA: Diagnosis not present

## 2022-08-28 DIAGNOSIS — Z30430 Encounter for insertion of intrauterine contraceptive device: Secondary | ICD-10-CM | POA: Diagnosis not present

## 2022-08-30 ENCOUNTER — Ambulatory Visit: Payer: BC Managed Care – PPO | Admitting: Family Medicine

## 2022-09-20 ENCOUNTER — Encounter: Payer: BC Managed Care – PPO | Admitting: Family Medicine

## 2022-09-30 ENCOUNTER — Encounter: Payer: BC Managed Care – PPO | Admitting: Family Medicine

## 2022-10-16 ENCOUNTER — Encounter: Payer: Self-pay | Admitting: Family Medicine

## 2022-10-18 ENCOUNTER — Ambulatory Visit (INDEPENDENT_AMBULATORY_CARE_PROVIDER_SITE_OTHER): Payer: BC Managed Care – PPO | Admitting: Family Medicine

## 2022-10-18 ENCOUNTER — Encounter: Payer: Self-pay | Admitting: Family Medicine

## 2022-10-18 VITALS — BP 122/72 | HR 101 | Temp 98.1°F | Ht 65.35 in | Wt 169.6 lb

## 2022-10-18 DIAGNOSIS — Z0000 Encounter for general adult medical examination without abnormal findings: Secondary | ICD-10-CM

## 2022-10-18 LAB — URINALYSIS, ROUTINE W REFLEX MICROSCOPIC
Bilirubin, UA: NEGATIVE
Glucose, UA: NEGATIVE
Ketones, UA: NEGATIVE
Nitrite, UA: NEGATIVE
Protein,UA: NEGATIVE
Specific Gravity, UA: 1.02 (ref 1.005–1.030)
Urobilinogen, Ur: 0.2 mg/dL (ref 0.2–1.0)
pH, UA: 8 — ABNORMAL HIGH (ref 5.0–7.5)

## 2022-10-18 LAB — MICROSCOPIC EXAMINATION: Bacteria, UA: NONE SEEN

## 2022-10-18 NOTE — Progress Notes (Signed)
BP 122/72   Pulse (!) 101   Temp 98.1 F (36.7 C) (Oral)   Ht 5' 5.35" (1.66 m)   Wt 169 lb 9.6 oz (76.9 kg)   LMP 10/12/2022 (Approximate)   SpO2 99%   BMI 27.92 kg/m    Subjective:    Patient ID: Faith [name], female    DOB: 2001-03-05, 21 y.o.   MRN: [id number]  HPI: Faith [name] is a 22 y.o. female presenting on 10/18/2022 for comprehensive medical examination. Current medical complaints include:none  She currently lives with: son, and SO Menopausal Symptoms: no  Depression Screen done today and results listed below:     10/18/2022    3:04 PM 06/11/2022    8:20 AM 03/21/2022   10:41 AM 09/17/2021    1:14 PM 04/23/2021    3:06 PM  Depression screen PHQ 2/9  Decreased Interest 0 0 0 1 1  Down, Depressed, Hopeless 0 1 0 0 1  PHQ - 2 Score 0 1 0 1 2  Altered sleeping 0 2 1 1 1   Tired, decreased energy 0 1 1 2 1   Change in appetite 0 0 0 2 2  Feeling bad or failure about yourself  0 0 0 1 0  Trouble concentrating 0 0 0 0 1  Moving slowly or fidgety/restless  0 0 0 1  Suicidal thoughts 0 0 0 0   PHQ-9 Score 0 4 2 7 8   Difficult doing work/chores Not difficult at all Somewhat difficult Somewhat difficult Somewhat difficult     Past Medical History:  Past Medical History:  Diagnosis Date   Allergy    GERD (gastroesophageal reflux disease)    Just began   Mononucleosis 22yo    Surgical History:  Past Surgical History:  Procedure Laterality Date   NO PAST SURGERIES      Medications:  Current Outpatient Medications on File Prior to Visit  Medication Sig   cetirizine (ZYRTEC) 10 MG tablet Take 1 tablet (10 mg total) by mouth daily.   EPINEPHrine 0.3 mg/0.3 mL IJ SOAJ injection Inject 0.3 mg into the muscle as needed for anaphylaxis.   EPINEPHrine 0.3 mg/0.3 mL IJ SOAJ injection Inject 0.3 mg into the muscle as needed for anaphylaxis.   hydrocortisone (ANUSOL-HC) 25 MG suppository Place 1 suppository (25 mg total) rectally 2 (two) times daily.   Current  Facility-Administered Medications on File Prior to Visit  Medication   medroxyPROGESTERone (DEPO-PROVERA) injection 150 mg    Allergies:  Allergies  Allergen Reactions   Depo-Estradiol [Estradiol Cypionate] Anaphylaxis   Depo-Provera [Medroxyprogesterone Acetate] Shortness Of Breath   Orange Fruit [Citrus] Anaphylaxis    Social History:  Social History   Socioeconomic History   Marital status: Single    Spouse name: Not on file   Number of children: Not on file   Years of education: Not on file   Highest education level: 12th grade  Occupational History   Not on file  Tobacco Use   Smoking status: Never   Smokeless tobacco: Never  Vaping Use   Vaping status: Some Days  Substance and Sexual Activity   Alcohol use: No   Drug use: No   Sexual activity: Yes    Birth control/protection: I.U.D.  Other Topics Concern   Not on file  Social History Narrative   Not on file   Social Determinants of Health   Financial Resource Strain: Low Risk  (06/07/2022)   Overall Financial Resource Strain (CARDIA)    Difficulty of  Paying Living Expenses: Not very hard  Food Insecurity: No Food Insecurity (06/07/2022)   Hunger Vital Sign    Worried About Running Out of Food in the Last Year: Never true    Ran Out of Food in the Last Year: Never true  Transportation Needs: No Transportation Needs (06/07/2022)   PRAPARE - Administrator, Civil Service (Medical): No    Lack of Transportation (Non-Medical): No  Physical Activity: Sufficiently Active (06/07/2022)   Exercise Vital Sign    Days of Exercise per Week: 4 days    Minutes of Exercise per Session: 90 min  Stress: Stress Concern Present (06/07/2022)   [employer] of Occupational Health - Occupational Stress Questionnaire    Feeling of Stress : Rather much  Social Connections: Moderately Isolated (06/07/2022)   Social Connection and Isolation Panel [NHANES]    Frequency of Communication with Friends and Family: Twice a  week    Frequency of Social Gatherings with Friends and Family: Once a week    Attends Religious Services: Never    Database administrator or Organizations: No    Attends Engineer, structural: Not on file    Marital Status: Living with partner  Intimate Partner Violence: Not on file   Social History   Tobacco Use  Smoking Status Never  Smokeless Tobacco Never   Social History   Substance and Sexual Activity  Alcohol Use No    Family History:  Family History  Problem Relation Age of Onset   Cancer Mother        Hodgkin's Lymphoma   Hearing loss Father    Depression Sister    Hypertension Maternal Grandmother    Hypertension Paternal Grandmother    Cancer Paternal Grandmother    Cancer Paternal Grandfather        Lung    Past medical history, surgical history, medications, allergies, family history and social history reviewed with patient today and changes made to appropriate areas of the chart.   Review of Systems  Constitutional: Negative.   HENT: Negative.    Eyes: Negative.   Respiratory: Negative.    Cardiovascular: Negative.        Tightness in her chest  Gastrointestinal:  Positive for heartburn. Negative for abdominal pain, blood in stool, constipation, diarrhea, melena, nausea and vomiting.  Genitourinary: Negative.   Musculoskeletal: Negative.   Skin: Negative.   Neurological: Negative.   Endo/Heme/Allergies: Negative.   Psychiatric/Behavioral: Negative.     All other ROS negative except what is listed above and in the HPI.      Objective:    BP 122/72   Pulse (!) 101   Temp 98.1 F (36.7 C) (Oral)   Ht 5' 5.35" (1.66 m)   Wt 169 lb 9.6 oz (76.9 kg)   LMP 10/12/2022 (Approximate)   SpO2 99%   BMI 27.92 kg/m   Wt Readings from Last 3 Encounters:  10/18/22 169 lb 9.6 oz (76.9 kg)  06/17/22 148 lb (67.1 kg)  06/11/22 148 lb (67.1 kg)    Physical Exam Vitals and nursing note reviewed.  Constitutional:      General: She is not in  acute distress.    Appearance: Normal appearance. She is normal weight. She is not ill-appearing, toxic-appearing or diaphoretic.  HENT:     Head: Normocephalic and atraumatic.     Right Ear: Tympanic membrane, ear canal and external ear normal. There is no impacted cerumen.     Left Ear: Tympanic membrane,  ear canal and external ear normal. There is no impacted cerumen.     Nose: Nose normal. No congestion or rhinorrhea.     Mouth/Throat:     Mouth: Mucous membranes are moist.     Pharynx: Oropharynx is clear. No oropharyngeal exudate or posterior oropharyngeal erythema.  Eyes:     General: No scleral icterus.       Right eye: No discharge.        Left eye: No discharge.     Extraocular Movements: Extraocular movements intact.     Conjunctiva/sclera: Conjunctivae normal.     Pupils: Pupils are equal, round, and reactive to light.  Neck:     Vascular: No carotid bruit.  Cardiovascular:     Rate and Rhythm: Normal rate and regular rhythm.     Pulses: Normal pulses.     Heart sounds: No murmur heard.    No friction rub. No gallop.  Pulmonary:     Effort: Pulmonary effort is normal. No respiratory distress.     Breath sounds: Normal breath sounds. No stridor. No wheezing, rhonchi or rales.  Chest:     Chest wall: No tenderness.  Abdominal:     General: Abdomen is flat. Bowel sounds are normal. There is no distension.     Palpations: Abdomen is soft. There is no mass.     Tenderness: There is no abdominal tenderness. There is no right CVA tenderness, left CVA tenderness, guarding or rebound.     Hernia: No hernia is present.  Genitourinary:    Comments: Breast and pelvic exams deferred with shared decision making Musculoskeletal:        General: No swelling, tenderness, deformity or signs of injury.     Cervical back: Normal range of motion and neck supple. No rigidity. No muscular tenderness.     Right lower leg: No edema.     Left lower leg: No edema.  Lymphadenopathy:      Cervical: No cervical adenopathy.  Skin:    General: Skin is warm and dry.     Capillary Refill: Capillary refill takes less than 2 seconds.     Coloration: Skin is not jaundiced or pale.     Findings: No bruising, erythema, lesion or rash.  Neurological:     General: No focal deficit present.     Mental Status: She is alert and oriented to person, place, and time. Mental status is at baseline.     Cranial Nerves: No cranial nerve deficit.     Sensory: No sensory deficit.     Motor: No weakness.     Coordination: Coordination normal.     Gait: Gait normal.     Deep Tendon Reflexes: Reflexes normal.  Psychiatric:        Mood and Affect: Mood normal.        Behavior: Behavior normal.        Thought Content: Thought content normal.        Judgment: Judgment normal.     Results for orders placed or performed during the hospital encounter of 06/17/22  POC Urine Pregnancy, ED  Result Value Ref Range   Preg Test, Ur Negative Negative      Assessment & Plan:   Problem List Items Addressed This Visit   None Visit Diagnoses     Routine general medical examination at a health care facility    -  Primary   Vaccines up to date. Screening labs checked today. Pap N/A. Continue diet and exercise.  Call with any concerns. Continue to monitor.   Relevant Orders   CBC with Differential/Platelet   Comprehensive metabolic panel   Lipid Panel w/o Chol/HDL Ratio   Urinalysis, Routine w reflex microscopic   TSH   GC/Chlamydia Probe Amp   HIV antibody (with reflex)        Follow up plan: Return in about 1 year (around 10/18/2023) for physical.   LABORATORY TESTING:  - Pap smear: not applicable  IMMUNIZATIONS:   - Tdap: Tetanus vaccination status reviewed: last tetanus booster within 10 years. - Influenza: Refused - Pneumovax: Not applicable - Prevnar: Not applicable - COVID: Refused - HPV: Up to date   PATIENT COUNSELING:   Advised to take 1 mg of folate supplement per day if  capable of pregnancy.   Sexuality: Discussed sexually transmitted diseases, partner selection, use of condoms, avoidance of unintended pregnancy  and contraceptive alternatives.   Advised to avoid cigarette smoking.  I discussed with the patient that most people either abstain from alcohol or drink within safe limits (<=14/week and <=4 drinks/occasion for males, <=7/weeks and <= 3 drinks/occasion for females) and that the risk for alcohol disorders and other health effects rises proportionally with the number of drinks per week and how often a drinker exceeds daily limits.  Discussed cessation/primary prevention of drug use and availability of treatment for abuse.   Diet: Encouraged to adjust caloric intake to maintain  or achieve ideal body weight, to reduce intake of dietary saturated fat and total fat, to limit sodium intake by avoiding high sodium foods and not adding table salt, and to maintain adequate dietary potassium and calcium preferably from fresh fruits, vegetables, and low-fat dairy products.    stressed the importance of regular exercise  Injury prevention: Discussed safety belts, safety helmets, smoke detector, smoking near bedding or upholstery.   Dental health: Discussed importance of regular tooth brushing, flossing, and dental visits.    NEXT PREVENTATIVE PHYSICAL DUE IN 1 YEAR. Return in about 1 year (around 10/18/2023) for physical.

## 2023-01-28 IMAGING — MR MR HEAD W/O CM
12 series · 48 of 48 positions shown · non-contrast
Comparison: No pertinent prior exams available for comparison.

CLINICAL DATA: Chronic migraine without Lorraine without status
migrainosus, not intractable. Headache, chronic, new features or
increased frequency. Additional history provided by scanning
technologist: Patient reports migraines worsening since March 2020. Migraine since 8235.

EXAM:
MRI HEAD WITHOUT CONTRAST
TECHNIQUE: Multiplanar, multiecho pulse sequences of the brain and surrounding
structures were obtained without intravenous contrast.

[Series 5: ax dwi_tracew · axial · 3.0mm · 0.65mm/px · z∈[-115,+35]mm · 4 of 48 slices shown]
[im 1/48]
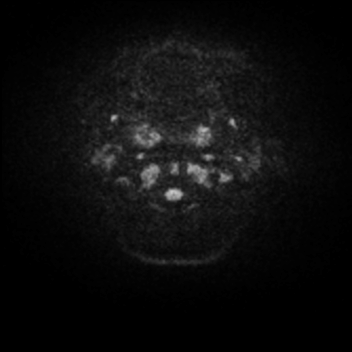
[im 16/48]
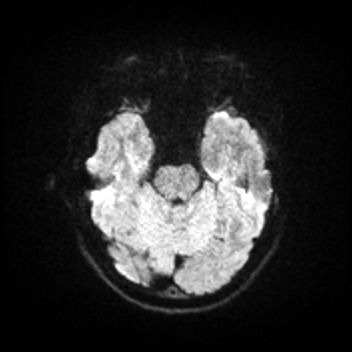
[im 32/48]
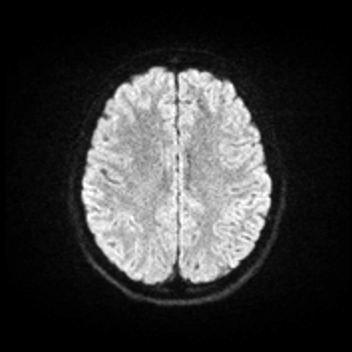
[im 48/48]
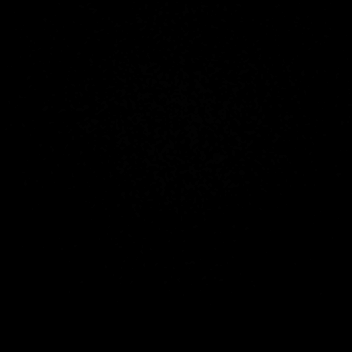

[Series 6: ax dwi_adc · axial · 3.0mm · 0.65mm/px · z∈[-115,+29]mm · 3 of 46 slices shown]
[im 1/46]
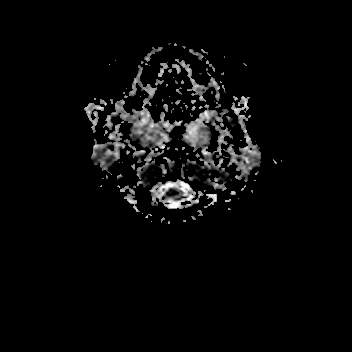
[im 23/46]
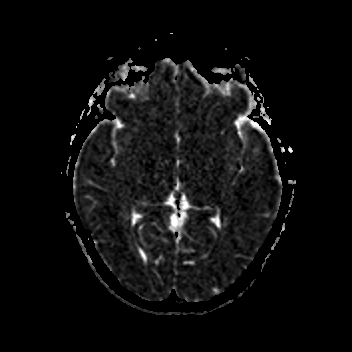
[im 46/46]
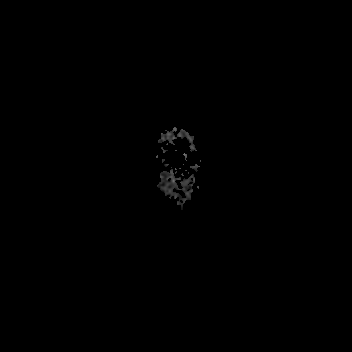

[Series 7: cor dwi_tracew · coronal · 5.0mm · 0.65mm/px · 3 of 40 slices shown]
[im 1/40]
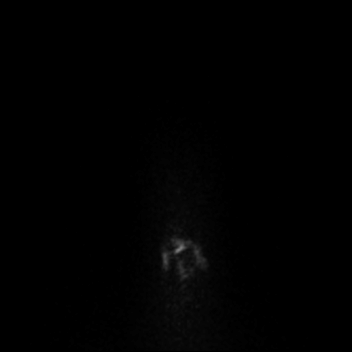
[im 20/40]
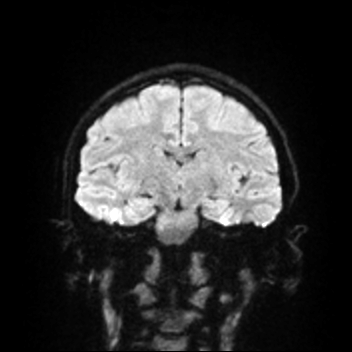
[im 40/40]
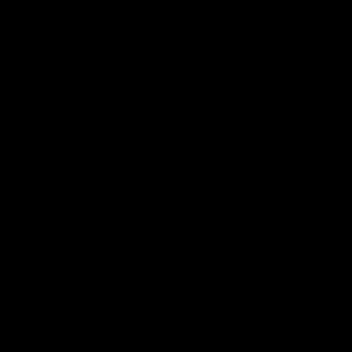

[Series 8: cor dwi_adc · coronal · 5.0mm · 0.65mm/px · 3 of 37 slices shown]
[im 1/37]
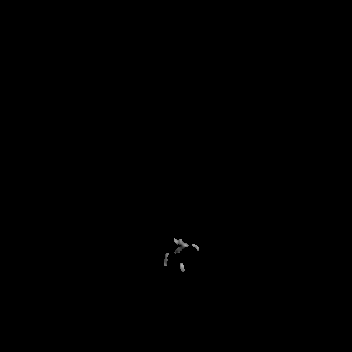
[im 19/37]
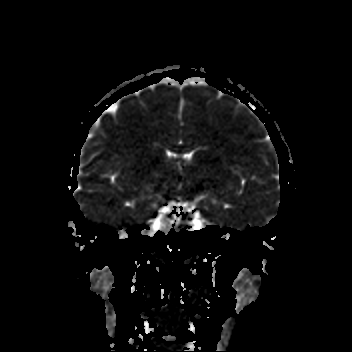
[im 37/37]
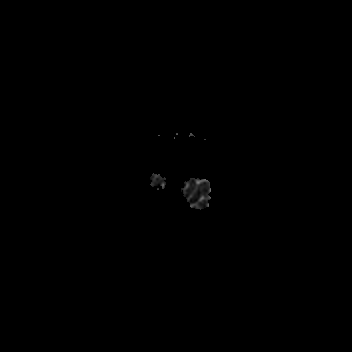

[Series 9: T1 · sagittal · 5.0mm · 0.62mm/px · 2 of 23 slices shown (1 of 2)]
[im 1/23]
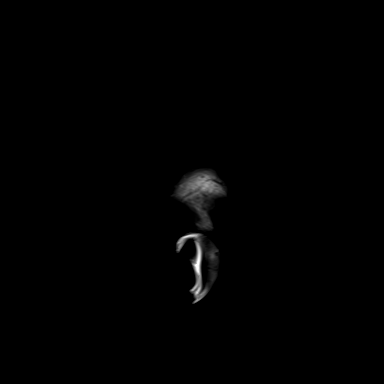
[im 23/23]
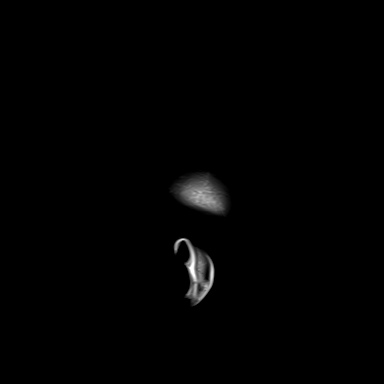

[Series 10: T2 · axial · 5.0mm · 0.53mm/px · z∈[-114,+37]mm · 2 of 26 slices shown (1 of 2)]
[im 1/26]
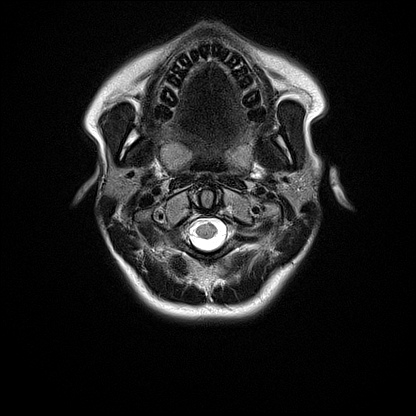
[im 26/26]
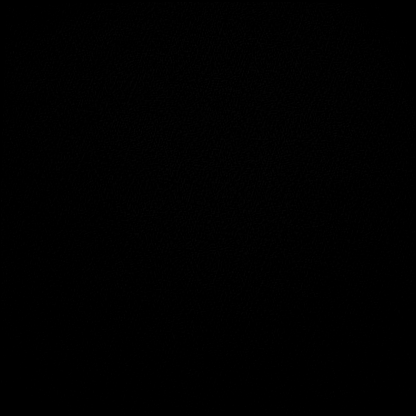

[Series 11: mag_images · axial · 3.0mm · 0.90mm/px · z∈[-126,+46]mm · 4 of 60 slices shown]
[im 1/60]
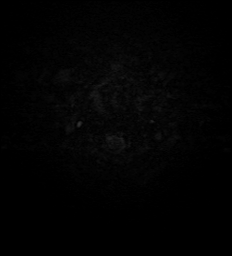
[im 20/60]
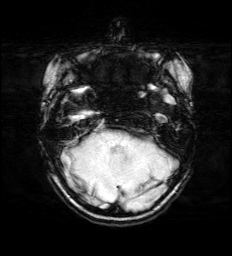
[im 40/60]
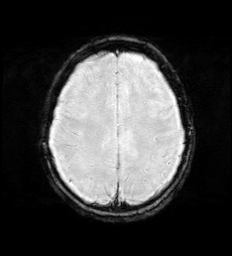
[im 60/60]
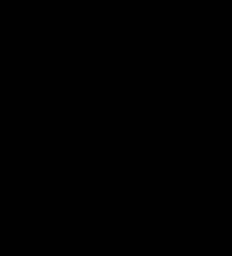

[Series 12: pha_images · axial · 3.0mm · 0.90mm/px · z∈[-126,+32]mm · 4 of 55 slices shown]
[im 1/55]
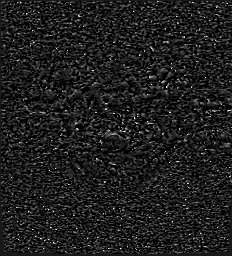
[im 19/55]
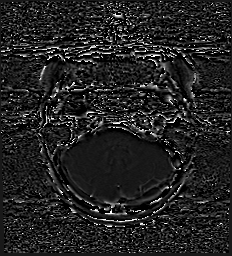
[im 37/55]
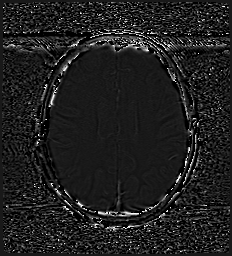
[im 55/55]
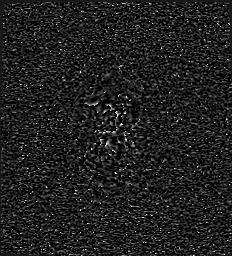

[Series 13: swi_images · axial · 3.0mm · 0.90mm/px · z∈[-126,+46]mm · 4 of 60 slices shown]
[im 1/60]
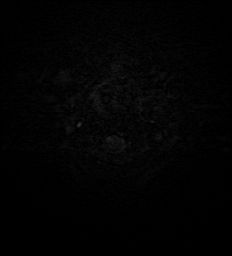
[im 20/60]
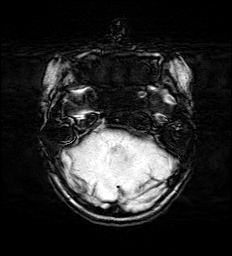
[im 40/60]
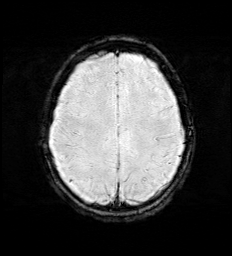
[im 60/60]
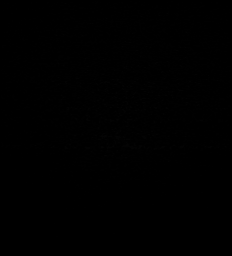

[Series 15: FLAIR · axial · 3.0mm · 0.53mm/px · z∈[-117,+40]mm · 4 of 55 slices shown]
[im 1/55]
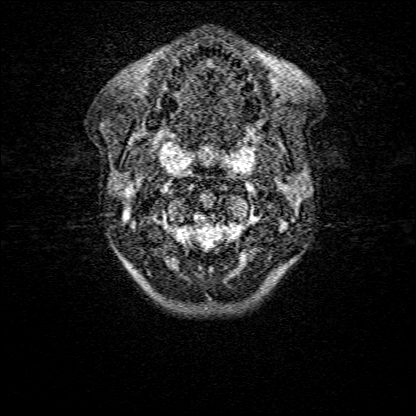
[im 19/55]
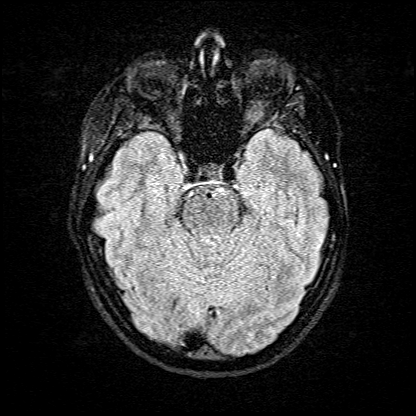
[im 37/55]
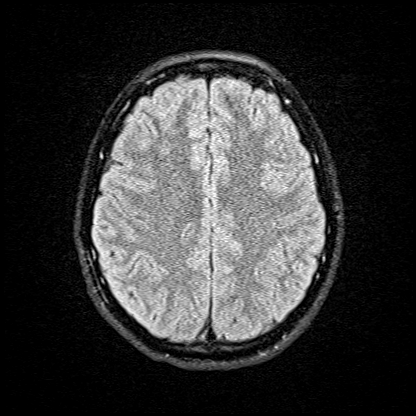
[im 55/55]
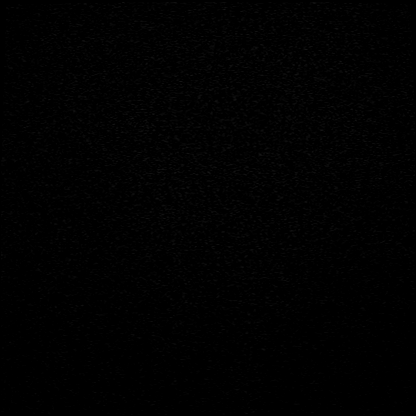

[Series 16: T1 · axial · 1.0mm · 0.98mm/px · z∈[-127,+42]mm · 13 of 176 slices shown (2 of 2)]
[im 1/176]
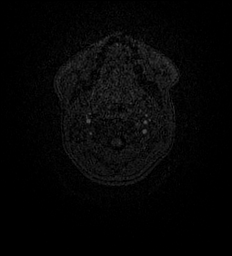
[im 15/176]
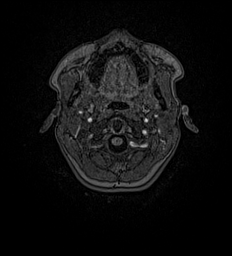
[im 30/176]
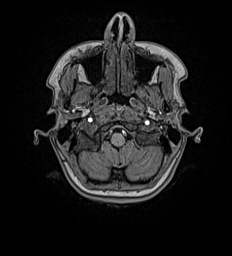
[im 44/176]
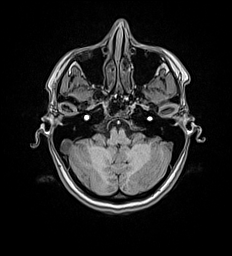
[im 59/176]
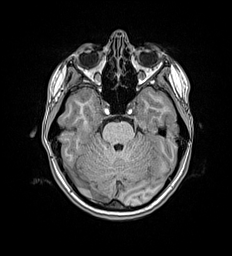
[im 73/176]
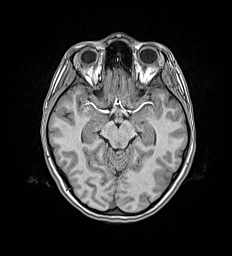
[im 88/176]
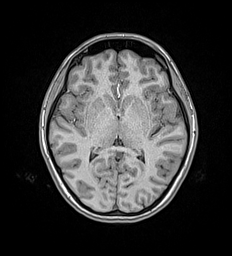
[im 103/176]
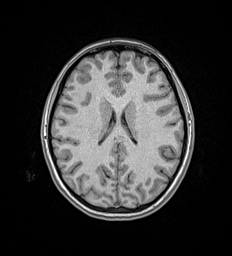
[im 117/176]
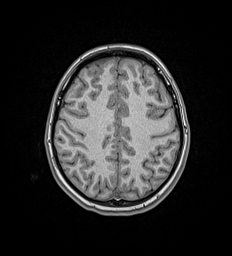
[im 132/176]
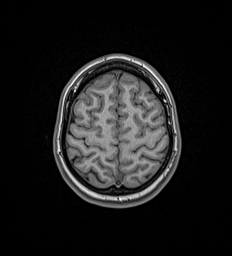
[im 146/176]
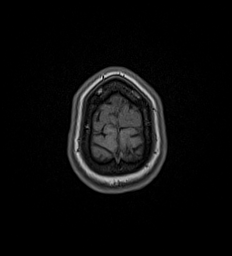
[im 161/176]
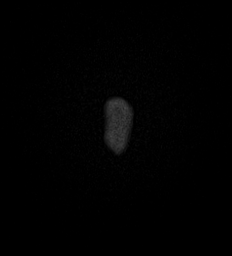
[im 176/176]
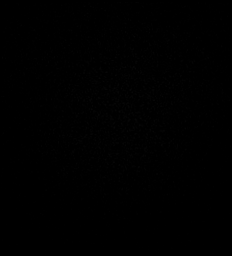

[Series 17: T2 · coronal · 5.0mm · 0.57mm/px · 2 of 29 slices shown (2 of 2)]
[im 1/29]
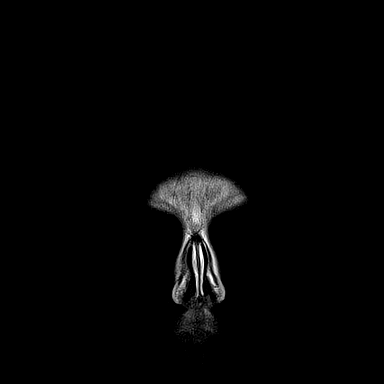
[im 29/29]
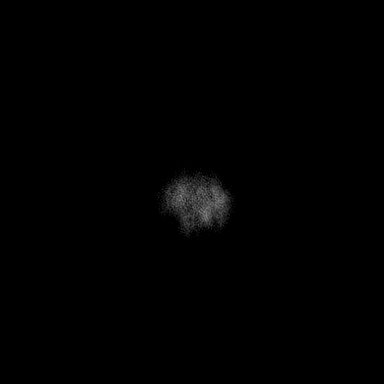

[48 of 48 positions shown; findings below may reference images not displayed]

FINDINGS: Brain:

Cerebral volume is normal.

No cortical encephalomalacia is identified. No significant white
matter disease.

There is no acute infarct.

No evidence of an intracranial mass.

No chronic intracranial blood products.

No extra-axial fluid collection.

No midline shift.

Vascular: Expected proximal arterial flow voids.

Skull and upper cervical spine: No focal marrow lesion.

Sinuses/Orbits: Visualized orbits show no acute finding. No
significant paranasal sinus disease.
IMPRESSION: Unremarkable non-contrast MRI appearance of the brain. No evidence
of acute intracranial abnormality.

## 2023-06-16 ENCOUNTER — Ambulatory Visit
Admission: EM | Admit: 2023-06-16 | Discharge: 2023-06-16 | Disposition: A | Discharge status: Home / Self Care | Attending: Family Medicine | Admitting: Family Medicine

## 2023-06-16 DIAGNOSIS — J101 Influenza due to other identified influenza virus with other respiratory manifestations: Secondary | ICD-10-CM | POA: Diagnosis not present

## 2023-06-16 LAB — RESP PANEL BY RT-PCR (FLU A&B, COVID) ARPGX2
Influenza A by PCR: POSITIVE — AB
Influenza B by PCR: NEGATIVE
SARS Coronavirus 2 by RT PCR: NEGATIVE

## 2023-06-16 LAB — MONONUCLEOSIS SCREEN: Mono Screen: NEGATIVE

## 2023-06-16 MED ORDER — ONDANSETRON HCL 4 MG PO TABS: 4.0000 mg | ORAL_TABLET | Freq: Four times a day (QID) | ORAL | 0 refills | Status: DC

## 2023-06-16 MED ORDER — ONDANSETRON 4 MG PO TBDP
4.0000 mg | ORAL_TABLET | Freq: Once | ORAL | Status: AC
  Administered 2023-06-16: 4 mg via ORAL

## 2023-06-16 NOTE — ED Triage Notes (Signed)
 Pt c/o R ear pain,cough,congestion & HA x4 days.

## 2023-06-16 NOTE — ED Provider Notes (Signed)
 [facility]    CSN: [id number] Arrival date & time: 06/16/23  1118      History   Chief Complaint Chief Complaint  Patient presents with   Otalgia   Cough   Headache    HPI Clothilde Tippetts is a 23 y.o. female.   HPI  History obtained from the patient. Kiyara presents for right ear pain, watery itches eyes, nasal congestion, cough, vomiting, headache, sore throat. No fever, diarrhea. Taking Sudafed, Allegra and ibuprofen but thee aren't helping. Her son had a ear infection. No other sick contacts. Has seasonal allergies but hasn't been taking her allergy medications like she is supposed to.   Believes she may have a sinus infection. Has some right jaw pain and neck discomfort.      Past Medical History:  Diagnosis Date   Allergy    GERD (gastroesophageal reflux disease)    Just began   Mononucleosis 23yo    Patient Active Problem List   Diagnosis Date Noted   Family planning 06/11/2022   History of pre-eclampsia 03/21/2022   Chronic migraine without aura without status migrainosus, not intractable 09/14/2020   Vertigo 03/22/2020   Menorrhagia with regular cycle 01/22/2017   Hearing loss 01/22/2017   Acne vulgaris 12/13/2015   Allergy     Past Surgical History:  Procedure Laterality Date   NO PAST SURGERIES      OB History     Gravida  1   Para  1   Term  0   Preterm  1   AB      Living  1      SAB      IAB      Ectopic      Multiple      Live Births  1            Home Medications    Prior to Admission medications   Medication Sig Start Date End Date Taking? Authorizing Provider  cetirizine (ZYRTEC) 10 MG tablet Take 1 tablet (10 mg total) by mouth daily. 09/14/20  Yes Johnson, Megan P, DO  EPINEPHrine 0.3 mg/0.3 mL IJ SOAJ injection Inject 0.3 mg into the muscle as needed for anaphylaxis. 06/11/22  Yes Willy Eddy, MD  EPINEPHrine 0.3 mg/0.3 mL IJ SOAJ injection Inject 0.3 mg into the muscle as needed for  anaphylaxis. 06/17/22  Yes Georga Hacking, MD  hydrocortisone (ANUSOL-HC) 25 MG suppository Place 1 suppository (25 mg total) rectally 2 (two) times daily. 03/21/22  Yes Johnson, Megan P, DO  ondansetron (ZOFRAN) 4 MG tablet Take 1 tablet (4 mg total) by mouth every 6 (six) hours. 06/16/23  Yes Katha Cabal, DO    Family History Family History  Problem Relation Age of Onset   Cancer Mother        Hodgkin's Lymphoma   Hearing loss Father    Depression Sister    Hypertension Maternal Grandmother    Hypertension Paternal Grandmother    Cancer Paternal Grandmother    Cancer Paternal Grandfather        Lung    Social History Social History   Tobacco Use   Smoking status: Never   Smokeless tobacco: Never  Vaping Use   Vaping status: Some Days  Substance Use Topics   Alcohol use: No   Drug use: No     Allergies   Bitter orange (citrus aurantium), Depo-estradiol [estradiol cypionate], Depo-provera [medroxyprogesterone acetate], and Orange fruit [citrus]   Review of Systems Review of Systems:  negative unless otherwise stated in HPI.      Physical Exam Triage Vital Signs ED Triage Vitals  Encounter Vitals Group     BP 06/16/23 1123 129/85     Systolic BP Percentile --      Diastolic BP Percentile --      Pulse Rate 06/16/23 1123 94     Resp 06/16/23 1123 16     Temp 06/16/23 1123 (!) 97.5 F (36.4 C)     Temp Source 06/16/23 1123 Oral     SpO2 06/16/23 1123 97 %     Weight 06/16/23 1123 178 lb (80.7 kg)     Height 06/16/23 1123 5\' 4"  (1.626 m)     Head Circumference --      Peak Flow --      Pain Score 06/16/23 1125 7     Pain Loc --      Pain Education --      Exclude from Growth Chart --    No data found.  Updated Vital Signs BP 129/85 (BP Location: Right Arm)   Pulse 94   Temp (!) 97.5 F (36.4 C) (Oral)   Resp 16   Ht 5\' 4"  (1.626 m)   Wt 80.7 kg   LMP  (LMP Unknown)   SpO2 97%   BMI 30.55 kg/m   Visual Acuity Right Eye Distance:   Left Eye  Distance:   Bilateral Distance:    Right Eye Near:   Left Eye Near:    Bilateral Near:     Physical Exam GEN:     alert, ill but non-toxic appearing female    HENT:  mucus membranes moist, oropharyngeal without lesions, mild erythema, no tonsillar exudates,  moderate erythematous edematous turbinates, clear nasal discharge, bilateral TM normal EYES:   pupils equal and reactive, no scleral injection or discharge NECK:  normal ROM, + anterior and posterior lymphadenopathy, no meningismus   RESP:  no increased work of breathing, clear to auscultation bilaterally CVS:   regular rate and rhythm Skin:   warm and dry, no rash on visible skin    UC Treatments / Results  Labs (all labs ordered are listed, but only abnormal results are displayed) Labs Reviewed  RESP PANEL BY RT-PCR (FLU A&B, COVID) ARPGX2 - Abnormal; Notable for the following components:      Result Value   Influenza A by PCR POSITIVE (*)    All other components within normal limits  MONONUCLEOSIS SCREEN    EKG   Radiology No results found.  Procedures Procedures (including critical care time)  Medications Ordered in UC Medications  ondansetron (ZOFRAN-ODT) disintegrating tablet 4 mg (4 mg Oral Given 06/16/23 1154)    Initial Impression / Assessment and Plan / UC Course  I have reviewed the triage vital signs and the nursing notes.  Pertinent labs & imaging results that were available during my care of the patient were reviewed by me and considered in my medical decision making (see chart for details).       Pt is a 23 y.o. female who presents for 4 days of respiratory and GI symptoms. Laurelin is afebrile here. Satting well on room air. Given Zofran ODT for nausea and vomiting.   Overall pt is ill but non-toxic appearing, well hydrated, without respiratory distress. Pulmonary exam is unremarkable. Mono screen is negative. COVID and influenza panel obtained and was influenza A positive.  She is outside the  window for Tamiflu. Discussed symptomatic treatment.  Explained lack of  efficacy of antibiotics in viral disease.  Typical duration of symptoms discussed. Zofran prescribed.   Return and ED precautions given and voiced understanding. Discussed MDM, treatment plan and plan for follow-up with patient who agrees with plan.     Final Clinical Impressions(s) / UC Diagnoses   Final diagnoses:  Influenza A     Discharge Instructions      Your COVID and mono tests are negative however you have influenza A.  Tamiflu was not prescribed as you are outside the effective treatment window.  Your symptoms will gradually improve over the next 7 to 10 days.  The cough may last about 3 weeks.   Take ibuprofen 400 mg with Tylenol 1000 mg for fever, headache or body aches.   For cough: You can also use guaifenesin and dextromethorphan for cough. You can use a humidifier for chest congestion and cough.  If you don't have a humidifier, you can sit in the bathroom with the hot shower running.      For sore throat: try warm salt water gargles, Mucinex sore throat cough drops or cepacol lozenges, throat spray, warm tea or water with lemon/honey, popsicles or ice, or OTC cold relief medicine for throat discomfort. You can also purchase chloraseptic spray at the pharmacy or dollar store.   For congestion: take a daily anti-histamine like Zyrtec, Claritin, and a oral decongestant, such as pseudoephedrine.  You can also use Flonase 1-2 sprays in each nostril daily. Afrin is also a good option, if you do not have high blood pressure.    It is important to stay hydrated: drink plenty of fluids (water, gatorade/powerade/pedialyte, juices, or teas) to keep your throat moisturized and help further relieve irritation/discomfort.    Return or go to the Emergency Department if symptoms worsen or do not improve in the next few days      ED Prescriptions     Medication Sig Dispense Auth. Provider   ondansetron  (ZOFRAN) 4 MG tablet Take 1 tablet (4 mg total) by mouth every 6 (six) hours. 20 tablet Katha Cabal, DO      PDMP not reviewed this encounter.   Katha Cabal, DO 06/16/23 1246

## 2023-06-16 NOTE — Discharge Instructions (Addendum)
 Your COVID and mono tests are negative however you have influenza A.  Tamiflu was not prescribed as you are outside the effective treatment window.  Your symptoms will gradually improve over the next 7 to 10 days.  The cough may last about 3 weeks.   Take ibuprofen 400 mg with Tylenol 1000 mg for fever, headache or body aches.   For cough: You can also use guaifenesin and dextromethorphan for cough. You can use a humidifier for chest congestion and cough.  If you don't have a humidifier, you can sit in the bathroom with the hot shower running.      For sore throat: try warm salt water gargles, Mucinex sore throat cough drops or cepacol lozenges, throat spray, warm tea or water with lemon/honey, popsicles or ice, or OTC cold relief medicine for throat discomfort. You can also purchase chloraseptic spray at the pharmacy or dollar store.   For congestion: take a daily anti-histamine like Zyrtec, Claritin, and a oral decongestant, such as pseudoephedrine.  You can also use Flonase 1-2 sprays in each nostril daily. Afrin is also a good option, if you do not have high blood pressure.    It is important to stay hydrated: drink plenty of fluids (water, gatorade/powerade/pedialyte, juices, or teas) to keep your throat moisturized and help further relieve irritation/discomfort.    Return or go to the Emergency Department if symptoms worsen or do not improve in the next few days

## 2023-06-20 DIAGNOSIS — J029 Acute pharyngitis, unspecified: Secondary | ICD-10-CM | POA: Diagnosis not present

## 2023-06-20 DIAGNOSIS — R1310 Dysphagia, unspecified: Secondary | ICD-10-CM | POA: Diagnosis not present

## 2023-06-20 DIAGNOSIS — H6693 Otitis media, unspecified, bilateral: Secondary | ICD-10-CM | POA: Diagnosis not present

## 2023-07-15 DIAGNOSIS — Z30432 Encounter for removal of intrauterine contraceptive device: Secondary | ICD-10-CM | POA: Diagnosis not present

## 2023-07-15 DIAGNOSIS — Z3009 Encounter for other general counseling and advice on contraception: Secondary | ICD-10-CM | POA: Diagnosis not present

## 2023-07-25 ENCOUNTER — Encounter: Payer: Self-pay | Admitting: Family Medicine

## 2023-07-28 ENCOUNTER — Ambulatory Visit
Admission: EM | Admit: 2023-07-28 | Discharge: 2023-07-28 | Disposition: A | Discharge status: Home / Self Care | Attending: Family Medicine | Admitting: Family Medicine

## 2023-07-28 DIAGNOSIS — R112 Nausea with vomiting, unspecified: Secondary | ICD-10-CM | POA: Insufficient documentation

## 2023-07-28 DIAGNOSIS — Z3202 Encounter for pregnancy test, result negative: Secondary | ICD-10-CM | POA: Diagnosis not present

## 2023-07-28 DIAGNOSIS — H66001 Acute suppurative otitis media without spontaneous rupture of ear drum, right ear: Secondary | ICD-10-CM | POA: Insufficient documentation

## 2023-07-28 LAB — PREGNANCY, URINE: Preg Test, Ur: NEGATIVE

## 2023-07-28 MED ORDER — ONDANSETRON 4 MG PO TBDP
4.0000 mg | ORAL_TABLET | Freq: Once | ORAL | Status: AC
  Administered 2023-07-28: 4 mg via ORAL

## 2023-07-28 MED ORDER — ONDANSETRON 4 MG PO TBDP: 4.0000 mg | ORAL_TABLET | Freq: Three times a day (TID) | ORAL | 0 refills | Status: DC | PRN

## 2023-07-28 MED ORDER — AMOXICILLIN-POT CLAVULANATE 875-125 MG PO TABS: 1.0000 | ORAL_TABLET | Freq: Two times a day (BID) | ORAL | 0 refills | Status: DC

## 2023-07-28 NOTE — ED Triage Notes (Signed)
 Patient presents to UC for right ear ache and dizziness x 4 days. Fever x 2 days. Tried OTC ear drops, tylenol, sudafed.

## 2023-07-28 NOTE — Discharge Instructions (Signed)
 You have a right sided ear infection. Your pregnancy test is negative. It is important that you stay hydrated. See handout for information on rehydration.     Stop by the pharmacy to pick up your prescriptions.  Follow up with your primary care provider or return to the urgent care, if not improving.

## 2023-07-28 NOTE — ED Provider Notes (Signed)
 [facility]    CSN: [id number] Arrival date & time: 07/28/23  1316      History   Chief Complaint Chief Complaint  Patient presents with   Otalgia    HPI Faith [name] is a 23 y.o. female.   HPI   Faith [name] presents for right ear pain that started Friday night. Pain described as throbbing and pulsing ear pain that radiates to her neck. Had fever and nausea.  Tmax 101.32F.    Has been vomiting. Has nausea. Last ate 2 hours ago.  Patient's last menstrual period was 05/28/2023 (approximate). No diarrhea. Has not been able to poop since ear pain starting.      Past Medical History:  Diagnosis Date   Allergy    GERD (gastroesophageal reflux disease)    Just began   Mononucleosis 23yo    Patient Active Problem List   Diagnosis Date Noted   Family planning 06/11/2022   History of pre-eclampsia 03/21/2022   Chronic migraine without aura without status migrainosus, not intractable 09/14/2020   Vertigo 03/22/2020   Menorrhagia with regular cycle 01/22/2017   Hearing loss 01/22/2017   Acne vulgaris 12/13/2015   Allergy     Past Surgical History:  Procedure Laterality Date   NO PAST SURGERIES      OB History     Gravida  1   Para  1   Term  0   Preterm  1   AB      Living  1      SAB      IAB      Ectopic      Multiple      Live Births  1            Home Medications    Prior to Admission medications   Medication Sig Start Date End Date Taking? Authorizing Provider  cetirizine  (ZYRTEC ) 10 MG tablet Take 1 tablet (10 mg total) by mouth daily. 09/14/20   Johnson, Megan P, DO  EPINEPHrine  0.3 mg/0.3 mL IJ SOAJ injection Inject 0.3 mg into the muscle as needed for anaphylaxis. 06/11/22   Suellyn Emory, MD  EPINEPHrine  0.3 mg/0.3 mL IJ SOAJ injection Inject 0.3 mg into the muscle as needed for anaphylaxis. 06/17/22   Heather Litter, MD  hydrocortisone  (ANUSOL -HC) 25 MG suppository Place 1 suppository (25 mg total) rectally 2  (two) times daily. 03/21/22   Johnson, Megan P, DO  ondansetron  (ZOFRAN ) 4 MG tablet Take 1 tablet (4 mg total) by mouth every 6 (six) hours. 06/16/23   Michelle Wnek, DO    Family History Family History  Problem Relation Age of Onset   Cancer Mother        Hodgkin's Lymphoma   Hearing loss Father    Depression Sister    Hypertension Maternal Grandmother    Hypertension Paternal Grandmother    Cancer Paternal Grandmother    Cancer Paternal Grandfather        Lung    Social History Social History   Tobacco Use   Smoking status: Never   Smokeless tobacco: Never  Vaping Use   Vaping status: Some Days  Substance Use Topics   Alcohol use: No   Drug use: No     Allergies   Bitter orange (citrus aurantium), Depo-estradiol [estradiol cypionate], Depo-provera  [medroxyprogesterone  acetate], and Orange fruit [citrus]   Review of Systems Review of Systems: :negative unless otherwise stated in HPI.      Physical Exam Triage Vital Signs ED  Triage Vitals  Encounter Vitals Group     BP 07/28/23 1352 (!) 135/94     Systolic BP Percentile --      Diastolic BP Percentile --      Pulse Rate 07/28/23 1352 (!) 106     Resp 07/28/23 1352 16     Temp 07/28/23 1352 98.3 F (36.8 C)     Temp Source 07/28/23 1352 Oral     SpO2 07/28/23 1352 98 %     Weight --      Height --      Head Circumference --      Peak Flow --      Pain Score 07/28/23 1350 7     Pain Loc --      Pain Education --      Exclude from Growth Chart --    No data found.  Updated Vital Signs BP (!) 135/94 (BP Location: Right Arm)   Pulse (!) 106   Temp 98.3 F (36.8 C) (Oral)   Resp 16   LMP 05/28/2023 (Approximate)   SpO2 98%   Breastfeeding No   Visual Acuity Right Eye Distance:   Left Eye Distance:   Bilateral Distance:    Right Eye Near:   Left Eye Near:    Bilateral Near:     Physical Exam GEN:     alert, non-toxic appearing female in no distress ***   HENT:  mucus membranes moist,  oropharyngeal ***without lesions or ***exudate, no tonsillar hypertrophy***, *** no erythema , *** moderate turbinate hypertrophy, ***clear nasal discharge, right TM ***, left TM ***, normal external auditory canals bilaterally, nontender tragus ENT Physical Exam   EYES:   ***no scleral injection NECK:  normal ROM, no ***lymphadenopathy, ***no meningismus   RESP:  no increased work of breathing, clear to auscultation bilaterally,  *** CVS:   regular rate and rhythm*** Skin:   warm and dry, no rash on visible skin***, normal*** skin turgor    UC Treatments / Results  Labs (all labs ordered are listed, but only abnormal results are displayed) Labs Reviewed - No data to display  EKG   Radiology No results found.  Procedures Procedures (including critical care time)  Medications Ordered in UC Medications - No data to display  Initial Impression / Assessment and Plan / UC Course  I have reviewed the triage vital signs and the nursing notes.  Pertinent labs & imaging results that were available during my care of the patient were reviewed by me and considered in my medical decision making (see chart for details).     ***   ***Acute Otitis media Overall patient is well-appearing, well-hydrated and without respiratory distress. [name] is afebrile. Treat with amoxicillin  for 10 days.  Tylenol/Motrin's as needed for fever or discomfort.  Stressed importance of hydration.  Work/school *** note provided, per request.   ***Cerumen Impaction  Ceruminosis is noted.  Wax is removed by syringing and manual debridement. Instructions for home care to prevent wax buildup are given.  ***History consistent with ***viral respiratory illness. Overall pt is well appearing, well hydrated, without respiratory distress. Discussed symptomatic treatment. ***COVID testing obtained and was negative. Pt to quarantine until COVID test results or longer if positive. Explained lack of efficacy of  antibiotics in viral disease. - continue Tylenol/ ***Motrin as needed for discomfort/discomfort - nasal saline to help with his nasal congestion - Use a mist humidifier to help with breathing - Stressed importance of hydration - Albuterol to help with  chest tightness ***  - Zofran  for nausea *** - Work/school note provided ***  - Refilled ***allergy medication/ Flonase  - Discussed return and ED precautions, understanding voiced   Discussed MDM, treatment plan and plan for follow-up with patient/parent who agrees with plan.   Final Clinical Impressions(s) / UC Diagnoses   Final diagnoses:  None   Discharge Instructions   None    ED Prescriptions   None    PDMP not reviewed this encounter.

## 2023-07-29 ENCOUNTER — Encounter: Payer: Self-pay | Admitting: Family Medicine

## 2023-08-27 DIAGNOSIS — N6452 Nipple discharge: Secondary | ICD-10-CM | POA: Diagnosis not present

## 2023-08-27 DIAGNOSIS — N898 Other specified noninflammatory disorders of vagina: Secondary | ICD-10-CM | POA: Diagnosis not present

## 2023-09-07 DIAGNOSIS — R110 Nausea: Secondary | ICD-10-CM | POA: Diagnosis not present

## 2023-09-07 DIAGNOSIS — R000 Tachycardia, unspecified: Secondary | ICD-10-CM | POA: Diagnosis not present

## 2023-09-07 DIAGNOSIS — O2300 Infections of kidney in pregnancy, unspecified trimester: Secondary | ICD-10-CM | POA: Diagnosis not present

## 2023-09-07 DIAGNOSIS — R1032 Left lower quadrant pain: Secondary | ICD-10-CM | POA: Diagnosis not present

## 2023-09-07 DIAGNOSIS — O99891 Other specified diseases and conditions complicating pregnancy: Secondary | ICD-10-CM | POA: Diagnosis not present

## 2023-09-07 DIAGNOSIS — Z3A00 Weeks of gestation of pregnancy not specified: Secondary | ICD-10-CM | POA: Diagnosis not present

## 2023-09-07 DIAGNOSIS — M25552 Pain in left hip: Secondary | ICD-10-CM | POA: Diagnosis not present

## 2023-09-12 ENCOUNTER — Telehealth (INDEPENDENT_AMBULATORY_CARE_PROVIDER_SITE_OTHER)

## 2023-09-12 DIAGNOSIS — Z3687 Encounter for antenatal screening for uncertain dates: Secondary | ICD-10-CM

## 2023-09-12 DIAGNOSIS — Z3480 Encounter for supervision of other normal pregnancy, unspecified trimester: Secondary | ICD-10-CM | POA: Insufficient documentation

## 2023-09-12 NOTE — Patient Instructions (Signed)
 First Trimester of Pregnancy  The first trimester of pregnancy starts on the first day of your last monthly period until the end of week 13. This is months 1 through 3 of pregnancy. A week after a sperm fertilizes an egg, the egg will implant into the wall of the uterus and begin to develop into a baby. Body changes during your first trimester Your body goes through many changes during pregnancy. The changes usually return to normal after your baby is born. Physical changes Your breasts may grow larger and may hurt. The area around your nipples may get darker. Your periods will stop. Your hair and nails may grow faster. You may pee more often. Health changes You may tire easily. Your gums may bleed and may be sensitive when you brush and floss. You may not feel hungry. You may have heartburn. You may throw up or feel like you may throw up. You may want to eat some foods, but not others. You may have headaches. You may have trouble pooping (constipation). Other changes Your emotions may change from day to day. You may have more dreams. Follow these instructions at home: Medicines Talk to your health care provider if you're taking medicines. Ask if the medicines are safe to take during pregnancy. Your provider may change the medicines that you take. Do not take any medicines unless told to by your provider. Take a prenatal vitamin that has at least 600 micrograms (mcg) of folic acid. Do not use herbal medicines, illegal substances, or medicines that are not approved by your provider. Eating and drinking While you're pregnant your body needs extra food for your growing baby. Talk with your provider about what to eat while pregnant. Activity Most women are able to exercise during pregnancy. Exercises may need to change as your pregnancy goes on. Talk to your provider about your activities and exercise routines. Relieving pain and discomfort Wear a good, supportive bra if your breasts  hurt. Rest with your legs raised if you have leg cramps or low back pain. Safety Wear your seatbelt at all times when you're in a car. Talk to your provider if someone hits you, hurts you, or yells at you. Talk with your provider if you're feeling sad or have thoughts of hurting yourself. Lifestyle Certain things can be harmful while you're pregnant. Follow these rules: Do not use hot tubs, steam rooms, or saunas. Do not douche. Do not use tampons or scented pads. Do not drink alcohol,smoke, vape, or use products with nicotine or tobacco in them. If you need help quitting, talk with your provider. Avoid cat litter boxes and soil used by cats. These things carry germs that can cause harm to your pregnancy and your baby. General instructions Keep all follow-up visits. It helps you and your unborn baby stay as healthy as possible. Write down your questions. Take them to your visits. Your provider will: Talk with you about your overall health. Give you advice or refer you to specialists who can help with different needs, including: Prenatal education classes. Mental health and counseling. Foods and healthy eating. Ask for help if you need help with food. Call your dentist and ask to be seen. Brush your teeth with a soft toothbrush. Floss gently. Where to find more information American Pregnancy Association: americanpregnancy.org [employer]: acog.org Office on [employer] Health: [employer] Contact a health care provider if: You feel dizzy, faint, or have a fever. You vomit or have watery poop (diarrhea) for 2  days or more. You have abnormal discharge or bleeding from your vagina. You have pain when you pee or your pee smells bad. You have cramps, pain, or pressure in your belly area. Get help right away if: You have trouble breathing or chest pain. You have any kind of injury, such as from a fall or a car crash. These symptoms may be an  emergency. Get help right away. Call 911. Do not wait to see if the symptoms will go away. Do not drive yourself to the hospital. This information is not intended to replace advice given to you by your health care provider. Make sure you discuss any questions you have with your health care provider. Document Revised: 12/05/2022 Document Reviewed: 07/05/2022 Elsevier Patient Education  2024 Elsevier Inc.   Common Medications Safe in Pregnancy  Acne:      Constipation:  Benzoyl Peroxide     Colace  Clindamycin      Dulcolax Suppository  Topica Erythromycin     Fibercon  Salicylic Acid      Metamucil         Miralax AVOID:        Senakot   Accutane    Cough:  Retin-A       Cough Drops  Tetracycline      Phenergan w/ Codeine if Rx  Minocycline      Robitussin (Plain & DM)  Antibiotics:     Crabs/Lice:  Ceclor       RID  Cephalosporins    AVOID:  E-Mycins      Kwell  Keflex  Macrobid/Macrodantin   Diarrhea:  Penicillin      Kao-Pectate  Zithromax      Imodium AD         PUSH FLUIDS AVOID:       Cipro     Fever:  Tetracycline      Tylenol (Regular or Extra  Minocycline       Strength)  Levaquin      Extra Strength-Do not          Exceed 8 tabs/24 hrs Caffeine:        200mg /day (equiv. To 1 cup of coffee or  approx. 3 12 oz sodas)         Gas: Cold/Hayfever:       Gas-X  Benadryl      Mylicon  Claritin       Phazyme  **Claritin-D        Chlor-Trimeton    Headaches:  Dimetapp      ASA-Free Excedrin  Drixoral-Non-Drowsy     Cold Compress  Mucinex (Guaifenasin)     Tylenol (Regular or Extra  Sudafed/Sudafed-12 Hour     Strength)  **Sudafed PE Pseudoephedrine   Tylenol Cold & Sinus     Vicks Vapor Rub  Zyrtec  **AVOID if Problems With Blood Pressure         Heartburn: Avoid lying down for at least 1 hour after meals  Aciphex      Maalox     Rash:  Milk of Magnesia     Benadryl    Mylanta       1% Hydrocortisone Cream  Pepcid  Pepcid Complete   Sleep  Aids:  Prevacid      Ambien   Prilosec       Benadryl  Rolaids       Chamomile Tea  Tums (Limit 4/day)     Unisom  Tylenol PM         Warm milk-add vanilla or  Hemorrhoids:       Sugar for taste  Anusol/Anusol H.C.  (RX: Analapram 2.5%)  Sugar Substitutes:  Hydrocortisone OTC     Ok in moderation  Preparation H      Tucks        Vaseline lotion applied to tissue with wiping    Herpes:     Throat:  Acyclovir      Oragel  Famvir  Valtrex     Vaccines:         Flu Shot Leg Cramps:       *Gardasil  Benadryl      Hepatitis A         Hepatitis B Nasal Spray:       Pneumovax  Saline Nasal Spray     Polio Booster         Tetanus Nausea:       Tuberculosis test or PPD  Vitamin B6 25 mg TID   AVOID:    Dramamine      *Gardasil  Emetrol       Live Poliovirus  Ginger Root 250 mg QID    MMR (measles, mumps &  High Complex Carbs @ Bedtime    rebella)  Sea Bands-Accupressure    Varicella (Chickenpox)  Unisom 1/2 tab TID     *No known complications           If received before Pain:         Known pregnancy;   Darvocet       Resume series after  Lortab        Delivery  Percocet    Yeast:   Tramadol      Femstat  Tylenol 3      Gyne-lotrimin  Ultram       Monistat  Vicodin           MISC:         All Sunscreens           Hair Coloring/highlights          Insect Repellant's          (Including DEET)         Mystic Tans   Commonly Asked Questions During Pregnancy   Cats: A parasite can be excreted in cat feces.  To avoid exposure you need to have another person empty the little box.  If you must empty the litter box you will need to wear gloves.  Wash your hands after handling your cat.  This parasite can also be found in raw or undercooked meat so this should also be avoided.  Colds, Sore Throats, Flu: Please check your medication sheet to see what you can take for symptoms.  If your symptoms are unrelieved by these medications please call the office.  Dental Work: Most  any dental work Agricultural consultant recommends is permitted.  X-rays should only be taken during the first trimester if absolutely necessary.  Your abdomen should be shielded with a lead apron during all x-rays.  Please notify your provider prior to receiving any x-rays.  Novocaine is fine; gas is not recommended.  If your dentist requires a note from [place] prior to dental work please call the office and we will provide one for you.  Exercise: Exercise is an important part of staying healthy during your pregnancy.  You may continue most exercises you were accustomed to prior to pregnancy.  Later in your pregnancy you will most likely notice you have difficulty with activities requiring balance like riding a bicycle.  It is important that you listen to your body and avoid activities that put you at a higher risk of falling.  Adequate rest and staying well hydrated are a must!  If you have questions about the safety of specific activities ask your provider.    Exposure to Children with illness: Try to avoid obvious exposure; report any symptoms to [place] when noted,  If you have chicken pos, red measles or mumps, you should be immune to these diseases.   Please do not take any vaccines while pregnant unless you have checked with your OB provider.  Fetal Movement: After 28 weeks we recommend you do "kick counts" twice daily.  Lie or sit down in a calm quiet environment and count your baby movements "kicks".  You should feel your baby at least 10 times per hour.  If you have not felt 10 kicks within the first hour get up, walk around and have something sweet to eat or drink then repeat for an additional hour.  If count remains less than 10 per hour notify your provider.  Fumigating: Follow your pest control agent's advice as to how long to stay out of your home.  Ventilate the area well before re-entering.  Hemorrhoids:   Most over-the-counter preparations can be used during pregnancy.  Check your medication to see what is  safe to use.  It is important to use a stool softener or fiber in your diet and to drink lots of liquids.  If hemorrhoids seem to be getting worse please call the office.   Hot Tubs:  Hot tubs Jacuzzis and saunas are not recommended while pregnant.  These increase your internal body temperature and should be avoided.  Intercourse:  Sexual intercourse is safe during pregnancy as long as you are comfortable, unless otherwise advised by your provider.  Spotting may occur after intercourse; report any bright red bleeding that is heavier than spotting.  Labor:  If you know that you are in labor, please go to the hospital.  If you are unsure, please call the office and let us help you decide what to do.  Lifting, straining, etc:  If your job requires heavy lifting or straining please check with your provider for any limitations.  Generally, you should not lift items heavier than that you can lift simply with your hands and arms (no back muscles)  Painting:  Paint fumes do not harm your pregnancy, but may make you ill and should be avoided if possible.  Latex or water based paints have less odor than oils.  Use adequate ventilation while painting.  Permanents & Hair Color:  Chemicals in hair dyes are not recommended as they cause increase hair dryness which can increase hair loss during pregnancy.  " Highlighting" and permanents are allowed.  Dye may be absorbed differently and permanents may not hold as well during pregnancy.  Sunbathing:  Use a sunscreen, as skin burns easily during pregnancy.  Drink plenty of fluids; avoid over heating.  Tanning Beds:  Because their possible side effects are still unknown, tanning beds are not recommended.  Ultrasound Scans:  Routine ultrasounds are performed at approximately 20 weeks.  You will be able to see your baby's general anatomy an if you would like to know the gender this can usually be determined as well.  If it is questionable when you conceived you may  also  receive an ultrasound early in your pregnancy for dating purposes.  Otherwise ultrasound exams are not routinely performed unless there is a medical necessity.  Although you can request a scan we ask that you pay for it when conducted because insurance does not cover " patient request" scans.  Work: If your pregnancy proceeds without complications you may work until your due date, unless your physician or employer advises otherwise.  Round Ligament Pain/Pelvic Discomfort:  Sharp, shooting pains not associated with bleeding are fairly common, usually occurring in the second trimester of pregnancy.  They tend to be worse when standing up or when you remain standing for long periods of time.  These are the result of pressure of certain pelvic ligaments called "round ligaments".  Rest, Tylenol and heat seem to be the most effective relief.  As the womb and fetus grow, they rise out of the pelvis and the discomfort improves.  Please notify the office if your pain seems different than that described.  It may represent a more serious condition.

## 2023-09-12 NOTE — Progress Notes (Signed)
 New OB Intake  I connected with  Faith [name] on 09/12/23 at 10:15 AM EDT by MyChart Video Visit and verified that I am speaking with the correct person using two identifiers. Nurse is located at [facility] and pt is located at home.  I discussed the limitations, risks, security and privacy concerns of performing an evaluation and management service by telephone and the availability of in person appointments. I also discussed with the patient that there may be a patient responsible charge related to this service. The patient expressed understanding and agreed to proceed.  I explained I am completing New OB Intake today. We discussed her EDD of Unknown that is based on LMP of Unknown. Pt is G2/P1. I reviewed her allergies, medications, Medical/Surgical/OB history, and appropriate screenings. There are cats in the home: yes. If yes: Indoor. Based on history, this is a/an pregnancy uncomplicated . Her obstetrical history is significant for pre-eclampsia.  Patient Active Problem List   Diagnosis Date Noted   Supervision of other normal pregnancy, antepartum 09/12/2023   History of pre-eclampsia 03/21/2022   Chronic migraine without aura without status migrainosus, not intractable 09/14/2020   Vertigo 03/22/2020   Hearing loss 01/22/2017   Acne vulgaris 12/13/2015   Allergy     Concerns addressed today: None  Delivery Plans:  Plans to deliver at [facility].  Anatomy US  Explained first scheduled US  will be 09/18/23. Anatomy US  will be scheduled around [redacted] weeks gestational age.  Labs Discussed genetic screening with patient. Patient desires genetic testing to be drawn at new OB visit. Discussed possible labs to be drawn at new OB appointment.  COVID Vaccine Patient has had COVID vaccine.   Social Determinants of Health Food Insecurity: denies food insecurity  Transportation: Patient denies transportation needs. Childcare: Discussed no children allowed at ultrasound  appointments.   First visit review I reviewed new OB appt with pt. I explained she will have blood work and pap smear/pelvic exam if indicated. Explained pt will be seen by Harlene Cisco, CNM at first visit; encounter routed to appropriate provider.   Beola Skeens, CMA 09/12/2023  10:38 AM

## 2023-09-18 ENCOUNTER — Ambulatory Visit (INDEPENDENT_AMBULATORY_CARE_PROVIDER_SITE_OTHER)

## 2023-09-18 DIAGNOSIS — Z3687 Encounter for antenatal screening for uncertain dates: Secondary | ICD-10-CM

## 2023-09-18 DIAGNOSIS — Z3A01 Less than 8 weeks gestation of pregnancy: Secondary | ICD-10-CM | POA: Diagnosis not present

## 2023-09-30 ENCOUNTER — Other Ambulatory Visit (HOSPITAL_COMMUNITY)
Admission: RE | Admit: 2023-09-30 | Discharge: 2023-09-30 | Disposition: A | Discharge status: Home / Self Care | Source: Ambulatory Visit | Attending: Certified Nurse Midwife | Admitting: Certified Nurse Midwife

## 2023-09-30 ENCOUNTER — Ambulatory Visit: Admitting: Certified Nurse Midwife

## 2023-09-30 VITALS — BP 123/78 | HR 103 | Wt 166.2 lb

## 2023-09-30 DIAGNOSIS — Z113 Encounter for screening for infections with a predominantly sexual mode of transmission: Secondary | ICD-10-CM

## 2023-09-30 DIAGNOSIS — Z3480 Encounter for supervision of other normal pregnancy, unspecified trimester: Secondary | ICD-10-CM | POA: Diagnosis not present

## 2023-09-30 DIAGNOSIS — Z1332 Encounter for screening for maternal depression: Secondary | ICD-10-CM

## 2023-09-30 DIAGNOSIS — Z3A08 8 weeks gestation of pregnancy: Secondary | ICD-10-CM | POA: Diagnosis not present

## 2023-09-30 DIAGNOSIS — Z3481 Encounter for supervision of other normal pregnancy, first trimester: Secondary | ICD-10-CM | POA: Diagnosis not present

## 2023-09-30 DIAGNOSIS — Z1379 Encounter for other screening for genetic and chromosomal anomalies: Secondary | ICD-10-CM

## 2023-09-30 DIAGNOSIS — Z0184 Encounter for antibody response examination: Secondary | ICD-10-CM

## 2023-09-30 DIAGNOSIS — Z8759 Personal history of other complications of pregnancy, childbirth and the puerperium: Secondary | ICD-10-CM

## 2023-09-30 DIAGNOSIS — Z131 Encounter for screening for diabetes mellitus: Secondary | ICD-10-CM

## 2023-09-30 DIAGNOSIS — Z369 Encounter for antenatal screening, unspecified: Secondary | ICD-10-CM | POA: Diagnosis not present

## 2023-09-30 DIAGNOSIS — Z114 Encounter for screening for human immunodeficiency virus [HIV]: Secondary | ICD-10-CM

## 2023-09-30 NOTE — Progress Notes (Signed)
 NEW OB HISTORY AND PHYSICAL / SUBJECTIVE:       Faith [name] is a 23 y.o. G27P0101 female, Patient's last menstrual period was 05/28/2023 (approximate)., Estimated Date of Delivery: 11/07/24, presents today for establishment of Prenatal Care. She reports significant nausea.    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 Living situation: [place],  Exercise: no regular Substance use: denies  Indications for ASA therapy (per uptodate) One of the following: Previous pregnancy with preeclampsia, especially early onset and with an adverse outcome Yes Multifetal gestation No Chronic hypertension No Type 1 or 2 diabetes mellitus No Chronic kidney disease No Autoimmune disease (antiphospholipid syndrome, systemic lupus erythematosus) No  Two or more of the following: Nulliparity No Obesity (body mass index >30 kg/m2) No Family history of preeclampsia in mother or sister No Age >=35 years No Sociodemographic characteristics (African American race, low socioeconomic level) No Personal risk factors (eg, previous pregnancy with low birth weight or small for gestational age infant, previous adverse pregnancy outcome [eg, stillbirth], interval >10 years between pregnancies) No Daily 81mg  ASA to start after 12w EGA  Gynecologic History Patient's last menstrual period was 05/28/2023 (approximate). Normal Contraception: none Last Pap: 07/15/2023. Results were: NILM  Obstetric History OB History  Gravida Para Term Preterm AB Living  2 1 0 1  1  SAB IAB Ectopic Multiple Live Births      1    # Outcome Date GA Lbr Len/2nd Weight Sex Type Anes PTL Lv  2 Current           1 Preterm 11/25/21 [redacted]w[redacted]d  4 lb 15 oz (2.24 kg) M Vag-Spont   LIV    Past Medical History:  Diagnosis Date   Allergy    GERD (gastroesophageal reflux disease)    Just began   Mononucleosis 23yo    Past Surgical History:  Procedure Laterality Date   WISDOM TOOTH EXTRACTION      Current Outpatient Medications on  File Prior to Visit  Medication Sig Dispense Refill   EPINEPHrine  0.3 mg/0.3 mL IJ SOAJ injection Inject 0.3 mg into the muscle as needed for anaphylaxis. 1 each 10   ondansetron  (ZOFRAN ) 4 MG tablet Take 1 tablet (4 mg total) by mouth every 6 (six) hours. 20 tablet 0   Prenatal Vit-Fe Fumarate-FA (PRENATAL PO) Take 2 each by mouth daily. Taking 2 gummies daily.     No current facility-administered medications on file prior to visit.    Allergies  Allergen Reactions   Depo-Provera  [Medroxyprogesterone  Acetate] Shortness Of Breath   Orange Fruit [Citrus] Anaphylaxis    Social History   Socioeconomic History   Marital status: Single    Spouse name: Not on file   Number of children: 1   Years of education: Not on file   Highest education level: 12th grade  Occupational History   Occupation: Unemployed  Tobacco Use   Smoking status: Never   Smokeless tobacco: Never  Vaping Use   Vaping status: Every Day  Substance and Sexual Activity   Alcohol use: No   Drug use: No   Sexual activity: Yes    Partners: Male    Birth control/protection: None  Other Topics Concern   Not on file  Social History Narrative   Not on file   Social Drivers of Health   Financial Resource Strain: Low Risk  (09/09/2023)   Overall Financial Resource Strain (CARDIA)    Difficulty of Paying Living Expenses: Not very hard  Food Insecurity: No Food Insecurity (09/09/2023)   Hunger  Vital Sign    Worried About Programme researcher, broadcasting/film/video in the Last Year: Never true    Ran Out of Food in the Last Year: Never true  Transportation Needs: No Transportation Needs (09/09/2023)   PRAPARE - Administrator, Civil Service (Medical): No    Lack of Transportation (Non-Medical): No  Physical Activity: Insufficiently Active (09/09/2023)   Exercise Vital Sign    Days of Exercise per Week: 2 days    Minutes of Exercise per Session: 10 min  Stress: Stress Concern Present (09/09/2023)   [employer] of  Occupational Health - Occupational Stress Questionnaire    Feeling of Stress: To some extent  Social Connections: Moderately Isolated (09/09/2023)   Social Connection and Isolation Panel    Frequency of Communication with Friends and Family: Three times a week    Frequency of Social Gatherings with Friends and Family: Twice a week    Attends Religious Services: Never    Database administrator or Organizations: No    Attends Engineer, structural: Not on file    Marital Status: Living with partner  Intimate Partner Violence: Not At Risk (09/12/2023)   Humiliation, Afraid, Rape, and Kick questionnaire    Fear of Current or Ex-Partner: No    Emotionally Abused: No    Physically Abused: No    Sexually Abused: No    Family History  Problem Relation Age of Onset   Cancer Mother        Hodgkin's Lymphoma   Hearing loss Father    Depression Sister    Hypertension Maternal Grandmother    Hypertension Paternal Grandmother    Cancer Paternal Grandmother        related to Kidney   Cancer Paternal Grandfather        Lung    The following portions of the patient's history were reviewed and updated as appropriate: allergies, current medications, past OB history, past medical history, past surgical history, past family history, past social history, and problem list.  Constitutional: Denied constitutional symptoms, night sweats, recent illness, fatigue, fever, insomnia and weight loss.  Eyes: Denied eye symptoms, eye pain, photophobia, vision change and visual disturbance.  Ears/Nose/Throat/Neck: Denied ear, nose, throat or neck symptoms, hearing loss, nasal discharge, sinus congestion and sore throat.  Cardiovascular: Denied cardiovascular symptoms, arrhythmia, chest pain/pressure, edema, exercise intolerance, orthopnea and palpitations.  Respiratory: Denied pulmonary symptoms, asthma, pleuritic pain, productive sputum, cough, dyspnea and wheezing.  Gastrointestinal: Denied  gastro-esophageal reflux, melena; endorses nausea and vomiting.  Genitourinary: Denied genitourinary symptoms including symptomatic vaginal discharge, pelvic relaxation issues, and urinary complaints.  Musculoskeletal: Denied musculoskeletal symptoms, stiffness, swelling, muscle weakness and myalgia.  Dermatologic: Denied dermatology symptoms, rash and scar.  Neurologic: Denied neurology symptoms, dizziness, headache, neck pain and syncope.  Psychiatric: Denied psychiatric symptoms, anxiety and depression.  Endocrine: Denied endocrine symptoms including hot flashes and night sweats.     OBJECTIVE: Initial Physical Exam (New OB)  GENERAL APPEARANCE: alert, well appearing, in no apparent distress, oriented to person, place and time HEAD: normocephalic, atraumatic THYROID: no thyromegaly or masses present BREASTS: no masses noted, no significant tenderness, no palpable axillary nodes, no skin changes LUNGS: clear to auscultation, no wheezes, rales or rhonchi, symmetric air entry HEART: regular rate and rhythm, no murmurs ABDOMEN: soft, nontender, nondistended, no abnormal masses, no epigastric pain SKIN: normal coloration and turgor, no rashes NEUROLOGIC: alert, oriented, normal speech, no focal findings or movement disorder noted  PELVIC EXAM: deferred  +Cardiac  activity on bedside ultrasound  ASSESSMENT: Normal pregnancy Hx of pre-eclampsia with severe features Desires NIPT-aware must be collected after 10w EGA.   PLAN: Routine prenatal care. We discussed an overview of prenatal care and when to call. Reviewed diet, exercise, and weight gain recommendations in pregnancy. Discussed benefits of breastfeeding and lactation resources at [facility]. I reviewed labs and answered all questions.  1. Supervision of other normal pregnancy, antepartum (Primary) - Culture, OB Urine - Monitor Drug Profile 14(MW) - Nicotine  screen, urine - Urinalysis, Routine w reflex microscopic - Protein /  creatinine ratio, urine - NOB Panel; Future - Comprehensive metabolic panel; Future - Hemoglobin A1c; Future - TSH + free T4; Future - MaterniT 21 plus Core, Blood; Future  2. Screen for sexually transmitted diseases - Cervicovaginal ancillary only - NOB Panel; Future  3. Immunity status testing - NOB Panel; Future  4. Encounter for screening for maternal depression  5. Antenatal screening encounter - Culture, OB Urine - Monitor Drug Profile 14(MW) - Nicotine  screen, urine - Urinalysis, Routine w reflex microscopic - Protein / creatinine ratio, urine - NOB Panel; Future - Comprehensive metabolic panel; Future - Hemoglobin A1c; Future - TSH + free T4; Future - MaterniT 21 plus Core, Blood; Future  6. Screening for diabetes mellitus - Hemoglobin A1c; Future  7. Screening for human immunodeficiency virus  8. History of pre-eclampsia - Comprehensive metabolic panel; Future  9. Genetic screening - MaterniT 21 plus Core, Blood; Future   Harlene LITTIE Cisco, CNM

## 2023-09-30 NOTE — Patient Instructions (Signed)
 First Trimester of Pregnancy  The first trimester of pregnancy starts on the first day of your last monthly period until the end of week 13. This is months 1 through 3 of pregnancy. A week after a sperm fertilizes an egg, the egg will implant into the wall of the uterus and begin to develop into a baby. Body changes during your first trimester Your body goes through many changes during pregnancy. The changes usually return to normal after your baby is born. Physical changes Your breasts may grow larger and may hurt. The area around your nipples may get darker. Your periods will stop. Your hair and nails may grow faster. You may pee more often. Health changes You may tire easily. Your gums may bleed and may be sensitive when you brush and floss. You may not feel hungry. You may have heartburn. You may throw up or feel like you may throw up. You may want to eat some foods, but not others. You may have headaches. You may have trouble pooping (constipation). Other changes Your emotions may change from day to day. You may have more dreams. Follow these instructions at home: Medicines Talk to your health care provider if you're taking medicines. Ask if the medicines are safe to take during pregnancy. Your provider may change the medicines that you take. Do not take any medicines unless told to by your provider. Take a prenatal vitamin that has at least 600 micrograms (mcg) of folic acid. Do not use herbal medicines, illegal substances, or medicines that are not approved by your provider. Eating and drinking While you're pregnant your body needs extra food for your growing baby. Talk with your provider about what to eat while pregnant. Activity Most women are able to exercise during pregnancy. Exercises may need to change as your pregnancy goes on. Talk to your provider about your activities and exercise routines. Relieving pain and discomfort Wear a good, supportive bra if your breasts  hurt. Rest with your legs raised if you have leg cramps or low back pain. Safety Wear your seatbelt at all times when you're in a car. Talk to your provider if someone hits you, hurts you, or yells at you. Talk with your provider if you're feeling sad or have thoughts of hurting yourself. Lifestyle Certain things can be harmful while you're pregnant. Follow these rules: Do not use hot tubs, steam rooms, or saunas. Do not douche. Do not use tampons or scented pads. Do not drink alcohol,smoke, vape, or use products with nicotine or tobacco in them. If you need help quitting, talk with your provider. Avoid cat litter boxes and soil used by cats. These things carry germs that can cause harm to your pregnancy and your baby. General instructions Keep all follow-up visits. It helps you and your unborn baby stay as healthy as possible. Write down your questions. Take them to your visits. Your provider will: Talk with you about your overall health. Give you advice or refer you to specialists who can help with different needs, including: Prenatal education classes. Mental health and counseling. Foods and healthy eating. Ask for help if you need help with food. Call your dentist and ask to be seen. Brush your teeth with a soft toothbrush. Floss gently. Where to find more information American Pregnancy Association: americanpregnancy.org [employer]: acog.org Office on [employer] Health: TravelLesson.ca Contact a health care provider if: You feel dizzy, faint, or have a fever. You vomit or have watery poop (diarrhea) for 2  days or more. You have abnormal discharge or bleeding from your vagina. You have pain when you pee or your pee smells bad. You have cramps, pain, or pressure in your belly area. Get help right away if: You have trouble breathing or chest pain. You have any kind of injury, such as from a fall or a car crash. These symptoms may be an  emergency. Get help right away. Call 911. Do not wait to see if the symptoms will go away. Do not drive yourself to the hospital. This information is not intended to replace advice given to you by your health care provider. Make sure you discuss any questions you have with your health care provider. Document Revised: 12/05/2022 Document Reviewed: 07/05/2022 Elsevier Patient Education  2024 [employer]. [employer] Gain During Pregnancy Gaining some weight during pregnancy is normal and healthy. The amount of weight you should gain depends on your health and weight before pregnancy. Talk with your health care provider to find out the right amount of weight gain for you. The suggested weight gain is based on your body mass index, or BMI. Here's a general guide based on your weight before pregnancy: If you're underweight or have a BMI less than 18.5, you should gain 28-40 lb (13-18 kg). If you're at a normal weight with a BMI of 18.5-24.9, you should gain 25-35 lb (11-16 kg). If you're overweight with a BMI of 25-29.9, you should gain 15-25 lb (7-11 kg). If you're obese with a BMI of 30 or higher, you should gain 11-20 lb (5-9 kg). Your provider may suggest that you try to gain weight slower or gain more weight based on what's best for your baby and your health. What are the risks of unhealthy weight gain for me? Gaining too much weight during pregnancy can lead to: A type of diabetes that can happen during pregnancy. This is called gestational diabetes. Hypertensive disorders of pregnancy. These are problems that cause high blood pressure. Having a difficult delivery or a C-section. What are the risks of unhealthy weight gain for my baby? Gaining too little weight during pregnancy can lead to: Loss of the pregnancy. An early (preterm) birth. Your baby not growing normally. Your baby having a low weight at birth. Gaining too much weight during pregnancy can lead to: Your baby growing  larger than normal during pregnancy. Your baby having an increased risk of obesity. What actions can I take to gain a healthy amount of weight during pregnancy? Nutrition  Eat healthy foods. Every day, try to eat: Fruits and vegetables of various colors and kinds. Whole grains, like whole-wheat breads and oatmeal. Low-fat dairy foods such as yogurt, milk, and cheese. Or try non-dairy alternatives from soy or almond. Protein-rich foods, like lean meat, chicken, eggs, and beans. Avoid foods that are fried or have a lot of fat, salt, or sugar. Drink more fluids as told. Choose healthy snacks and drinks: Drink water. Avoid soda, sports drinks, and juices that have added sugar. Avoid drinks with caffeine, such as coffee and energy drinks. Eat snacks that are high in protein, such as nuts, protein bars, and low-fat yogurt. Carry snacks with you that don't need refrigeration, such as trail mix, an apple, or a bar. If you need help improving your diet, work with your provider or an expert in healthy eating called a dietitian. Activity  Exercise regularly, as told by your provider. If you were active before you became pregnant, you may be able to continue your  regular fitness activities. If you were not active before pregnancy, you may slowly build up to exercising for 30 or more minutes on most days of the week. This may include walking, swimming, or yoga. Ask your provider what activities are safe for you. Follow these instructions at home: Take medicines only as told. Take all prenatal supplements as told. Keep track of your weight gain during pregnancy. Keep all prenatal health care visits. These visits are a good time to discuss your weight gain. Your provider will check on your health and the health of your baby. Where to find support If you have questions or need help learning about healthy weight gain during pregnancy, these people may help: Your health care provider. A  dietitian. Where to find more information To learn more: Go to bitchilla.com. Click Search and type weight gain. Find the link you need. Contact a health care provider if: You're not able to eat or drink for longer than 24 hours. You can't afford food or have trouble getting regular meals. This information is not intended to replace advice given to you by your health care provider. Make sure you discuss any questions you have with your health care provider. Document Revised: 01/24/2023 Document Reviewed: 01/24/2023 Elsevier Patient Education  2025 [employer]. Genetic Testing During Pregnancy: What to Know Genetic testing is done when you're pregnant to check if your baby might have a congenital condition. A congenital condition is something a baby is born with, also called a birth condition. These conditions can happen when genes or chromosomes are not normal. Genes are tiny parts in your body that make up chromosomes. Chromosomes are groups of many genes. Together, they tell your body how to look and work. Genes are passed down from parents to their baby. Why is genetic testing done during pregnancy? Genetic testing allows you to: Talk about your test results and future plans with your health care team. Plan for a baby that may be born with a congenital condition. Make plans with your health care team in case your baby needs special care before or after birth. Think about your options regarding whether you want to continue with the pregnancy. Types of genetic tests A genetic test can be a screening test or a diagnostic test. Screening tests     Screening tests are used to check the risk of your baby having a congenital condition. They don't show if your baby actually has the condition. More testing will be needed to know for sure. Screening tests are recommended for all pregnant people. Screening tests will not hurt your baby. Types of screening tests include: Carrier  screening. The parents' blood or saliva is tested to check for genes that aren't normal. These genes can be passed to the baby. If both parents have the gene, the baby is at risk. First-trimester screening. This includes a maternal blood test and an ultrasound of your baby. This test checks for a risk of conditions related to chromosomes. It also looks for problems with your baby's heart, belly, or bones. Second-trimester screening. This may include a maternal blood test and an ultrasound of your baby. This test checks for the risk of conditions related to chromosomes. It also looks for problems with many parts of your baby's body. These include the brain, nose, mouth, spine, heart, and arms or legs. Some people may only have an ultrasound and not have a blood test. Combined or sequential screening. This looks at the results from the blood tests in the  first and second trimesters, along with the findings of the first-trimester ultrasound. It helps to tell you more about your baby. This type of testing may be more accurate than just doing screening in the first or second trimester by itself. Cell-free DNA testing. During pregnancy, cells from your placenta get into your blood, which is normal. This test is a blood test that looks at those cells. It's done after 10 weeks of pregnancy. It can be used to check for the risk of conditions caused by having too many chromosomes or an abnormal number of sex chromosomes.  Diagnostic tests Diagnostic tests are done only if your baby is known to be at risk of having a congenital condition. These tests check the cells from your baby to diagnose a condition. Examples of these tests include: Chorionic villus sampling (CVS). This is a procedure where cells are taken from the placenta for testing. To do this, a needle is put into your belly using guided ultrasound. Amniocentesis. This is a procedure where amniotic fluid is removed from the sac around your baby. The cells  from the placenta or amniotic fluid are tested for chromosomes that are not normal. What do the results mean? For a screening test: If your results are negative, it means that your baby is most likely not at a higher risk for a condition. There's still a small chance your baby could have a condition. If your results are positive, it means that your baby's risk for a condition is higher than normal. Your health care provider may want you to have a diagnostic test. For a diagnostic test: If the result is negative, it's not likely that your baby will have a condition. If the test is positive, your baby most likely has a condition. Talk with your provider about what your results mean and what your options are. Questions to ask your health care provider Talk with your provider about the conditions that run in your family. Ask these questions: Is my baby at risk for a congenital condition? What are the benefits of having genetic screening? Should I meet with a genetic counselor? Should my partner or other members of my family be tested? What tests are best for me and my baby? How much do the tests cost? Will my insurance cover the testing? What are the risks of each test? This information is not intended to replace advice given to you by your health care provider. Make sure you discuss any questions you have with your health care provider. Document Revised: 01/23/2023 Document Reviewed: 01/23/2023 Elsevier Patient Education  2025 [employer]. Pregnancy: Healthy Eating While you're pregnant, your body needs extra nutrition for your growing baby. You also need more vitamins and minerals, such as folic acid, calcium, iron, and vitamin D. Eating a balanced diet is important for both you and your baby. Your need for extra calories will change during pregnancy. During the first 3 months of pregnancy, called the first trimester, you don't need more calories. During the second trimester, you'll need  about 340 extra calories a day. During the third trimester, you'll need about 450 extra calories a day. If you're carrying more than one baby, talk with your health care provider or a dietitian to learn more about your specific eating needs. What are tips for eating healthy during pregnancy? Meal planning  Eating smaller meals throughout the day may help manage some side effects common in pregnancy, like heartburn and reflux. Eat a variety of foods. Be sure to include  many types of fruits and vegetables. Two or more servings of fish are recommended each week. Choose fish that are lower in mercury, such as salmon and pollock. Limit foods that have empty calories. These are foods that have little nutritional value, such as sweets, desserts, candies, and drinks with sugar in them. Drinks that have caffeine are OK to drink, but it's better to avoid caffeine. Limit your total caffeine intake to less than 200 mg each day, or the limit you're told by your provider. Be aware that 200 mg of caffeine is 12 oz or 355 mL of coffee, tea, or soda. General information Take a prenatal vitamin to help meet your vitamin and mineral needs during pregnancy. This includes your need for folic acid, iron, calcium, and vitamin D. Do not try to lose weight or go on a diet during pregnancy. Food safety  Wash your hands before you eat and after you prepare raw meat. Wash all fruits and vegetables well before peeling or eating. Make sure that all meats, poultry, and eggs are cooked to food-safe temperatures or well-done. Taking these actions can help keep your food safe and protect you and your baby from dangerous food illnesses. Ask your provider for more information. What foods should I eat? Fruits All fruits. Eat a variety of colors and types of fruit. Remember to wash your fruits well before peeling or eating. Vegetables All vegetables. Eat a variety of colors and types of vegetables. Remember to wash your  vegetables well before peeling or eating. Grains All grains. Choose whole grains, such as whole-wheat bread, oatmeal, or brown rice. Meats and other protein foods Lean meats, including chicken, [place], and lean cuts of beef, veal, or pork. Fish that is higher in omega-3 fatty acids and lower in mercury, such as salmon, herring, mussels, trout, sardines, pollock, shrimp, crab, and lobster. Tofu. Tempeh. Beans. Eggs. Peanut butter and other nut butters. Dairy Pasteurized milk and milk alternatives, such as soy milk. Pasteurized yogurt and pasteurized cheese. Cottage cheese. Sour cream. Beverages Water. Juices that contain 100% fruit juice or vegetable juice. Caffeine-free teas and decaffeinated coffee. Fats and oils Fats and oils are OK to include in moderation. Sweets and desserts Sweets and desserts are OK to include in moderation. Seasoning and other foods All pasteurized condiments. The items listed above may not be all the foods and drinks you can have. Talk with a dietitian to learn more. What does 340 extra calories look like? Healthy snacks that give you 340 more calories a day could be: Peanut butter and jelly with milk: 8 oz (237 mL) of low-fat milk. Peanut butter and jelly sandwich made with: 1 slice of whole-wheat bread. 2 teaspoons (10 g) of peanut butter. Yogurt and berries: 1 cup (245 g) of [place] yogurt. 1 cup (150 g) of berries. 2 tablespoons (30 g) of chopped nuts, such as almonds or walnuts. Avocado toast: 1 slice of whole-wheat bread. 1/2 medium avocado (70 g). 1 large egg (50 g). What foods should I avoid? Fruits Raw (unpasteurized) fruit juices. Vegetables Unpasteurized vegetable juices. Meats and other protein foods Precooked or cured meat, such as bologna, hot dogs, sausages, or meat loaves. (If you must eat those meats, reheat them until they are steaming hot.) Refrigerated pate, meat spreads from a meat counter, or smoked seafood that's found in the  refrigerated section of a store. Raw or undercooked meats, poultry, and eggs. Raw fish, such as sushi or sashimi. Fish that have high mercury content, such as tilefish,  shark, swordfish, and king mackerel. Dairy Unpasteurized or raw milk and any foods that are made from them. Some of these may be: Homemade yogurts or puddings. Soft cheeses such as: Feta. Queso blanco or fresco. Pharmacist, hospital or [place]. Blue-veined cheeses. Some of these types of cheeses may be made with pasteurized milk. Check the label. If pasteurized milk is used, they are OK to eat during pregnancy. Deli foods Premade foods from a store or deli, like chicken salad, coleslaw, or egg salad. These are riskier for food illness than fresh or homemade salads. Beverages Alcohol. Sugar-sweetened drinks, such as sodas or teas. Energy drinks. Seasoning and other foods Homemade fermented foods and drinks, such as: Pickles. Sauerkraut. Kombucha. Store-bought pasteurized versions of these are OK. The items listed above may not be all the foods and drinks you should avoid. Talk with a dietitian to learn more. Where to find more information To learn more, go to: Centers for Disease Control and Prevention at TonerPromos.no. Click Search and type food choices for pregnancy. Find the link you need. MyPlate at http://pittman-dennis.biz/. This information is not intended to replace advice given to you by your health care provider. Make sure you discuss any questions you have with your health care provider. Document Revised: 02/19/2023 Document Reviewed: 02/19/2023 Elsevier Patient Education  2025 [employer]. Second Trimester of Pregnancy  The second trimester of pregnancy is from week 14 through week 27. This is months 4 through 6 of pregnancy. During the second trimester: Morning sickness is less or has stopped. You may have more energy. You may feel hungry more often. At this time, your unborn baby is growing very fast. At the end  of the sixth month, the unborn baby may be up to 12 inches long and weigh about 1 pounds. You will likely start to feel the baby move between 16 and 20 weeks of pregnancy. Body changes during your second trimester Your body continues to change during this time. The changes usually go away after your baby is born. Physical changes You will gain more weight. Your belly will get bigger. You may begin to get stretch marks on your hips, belly, and breasts. Your breasts will keep growing and may hurt. You may get dark spots or blotches on your face. A dark line from your belly button to the pubic area may appear. This line is called linea nigra. Your hair may grow faster and get thicker. Health changes You may have headaches. You may have heartburn. You may pee more often. You may have swollen, bulging veins (varicose veins). You may have trouble pooping (constipation), or swollen veins in the butt that can itch or get painful (hemorrhoids). You may have back pain. This is caused by: Weight gain. Pregnancy hormones that are relaxing the joints in your pelvis. Follow these instructions at home: Medicines Talk to your health care provider if you're taking medicines. Ask if the medicines are safe to take during pregnancy. Your provider may change the medicines that you take. Do not take any medicines unless told to by your provider. Take a prenatal vitamin that has at least 600 micrograms (mcg) of folic acid. Do not use herbal medicines, illegal drugs, or medicines that are not approved by your provider. Eating and drinking While you're pregnant your body needs extra food for your growing baby. Talk with your provider about what to eat while pregnant. Activity Most women are able to exercise during pregnancy. Exercises may need to change as your pregnancy goes on. Talk to  your provider about your activities and exercise routines. Relieving pain and discomfort Wear a good, supportive bra if  your breasts hurt. Rest with your legs raised if you have leg cramps or low back pain. Take warm sitz baths to soothe pain from hemorrhoids. Use hemorrhoid cream if your provider says it's okay. Do not douche. Do not use tampons or scented pads. Do not use hot tubs, steam rooms, or saunas. Safety Wear your seatbelt at all times when you're in a car. Talk to your provider if someone hits you, hurts you, or yells at you. Talk with your provider if you're feeling sad or have thoughts of hurting yourself. Lifestyle Certain things can be harmful while you're pregnant. It's best to avoid the following: Do not drink alcohol,smoke, vape, or use products with nicotine or tobacco in them. If you need help quitting, talk with your provider. Avoid cat litter boxes and soil used by cats. These things carry germs that can cause harm to your pregnancy and your baby. General instructions Keep all follow-up visits. It helps you and your unborn baby stay as healthy as possible. Write down your questions. Take them to your prenatal visits. Your provider will: Talk with you about your overall health. Give you advice or refer you to specialists who can help with different needs, including: Prenatal education classes. Mental health and counseling. Foods and healthy eating. Ask for help if you need help with food. Where to find more information American Pregnancy Association: americanpregnancy.org [employer]: acog.org Office on [employer] Health: TravelLesson.ca Contact a health care provider if: You have a headache that does not go away when you take medicine. You have any of these problems: You can't eat or drink. You throw up or feel like you may throw up. You have watery poop (diarrhea) for 2 days or more. You have pain when you pee or your pee smells bad. You have been sick for 2 days or more and are not getting better. Contact your provider right away if: You  have any of these coming from your vagina: Abnormal discharge. Bad-smelling fluid. Bleeding. Your baby is moving less than usual. You have contractions, belly cramping, or have pain in your pelvis or lower back. You have symptoms of high blood pressure or preeclampsia. These include: A severe, throbbing headache that does not go away. Sudden or extreme swelling of your face, hands, legs, or feet. Vision problems: You see spots. You have blurry vision. Your eyes are sensitive to light. If you can't reach the provider, go to an urgent care or emergency room. Get help right away if: You faint, become confused, or can't think clearly. You have chest pain or trouble breathing. You have any kind of injury, such as from a fall or a car crash. These symptoms may be an emergency. Call 911 right away. Do not wait to see if the symptoms will go away. Do not drive yourself to the hospital. This information is not intended to replace advice given to you by your health care provider. Make sure you discuss any questions you have with your health care provider. Document Revised: 12/05/2022 Document Reviewed: 07/05/2022 Elsevier Patient Education  2024 Elsevier Inc. Benefits of Breastfeeding Breastfeeding has many health benefits for babies and their breastfeeding parents. Breast milk is the best form of nutrition for babies. Breastfeeding is recommended until a child is 68 years old or older, as long as this is wanted by both the breastfeeding parent and child. Breastfeeding  can be challenging, especially during the first few weeks after your baby is born. Ask your health care provider or breastfeeding specialist (lactation consultant) for more information and help if needed. Asking for help early can provide the support you need for successful breastfeeding. Breastfeeding is not the only way to feed your baby breast milk. Some parents choose not to feed their babies directly from the breast. Instead,  they pump and bottle-feed their babies expressed breast milk. How do breastfeeding and pumping benefit me? Breastfeeding and pumping may: Help to create a bond between you and your baby. Slow bleeding after you give birth. Help your uterus return to its size before pregnancy. Help you lose the weight gained during pregnancy. Lower your risk of developing type 2 diabetes, weak bones (osteoporosis), rheumatoid arthritis, cardiovascular disease, and some forms of cancer later in life. How does breast milk benefit my baby? Nutrients in breast milk are better for your baby compared to nutrients in infant formula. Breast milk is easier for your baby to digest. The first milk (colostrum)helps your baby's digestive system function better. Breast milk lowers the risk of problems with the stomach and intestines. This includes necrotizing enterocolitis (NEC) in newborns. NEC is the inflammation and death of tissue in the intestine. Babies born early (premature) are at a higher risk of developing NEC. As your baby grows, your body changes the nutrients in your breast milk to meet your baby's needs. Breast milk improves your baby's brain development. Breast milk lowers your baby's risk for sudden infant death syndrome (SIDS). Breast milk can lower your baby's risk of: Ear infections. Infections of the nose, throat, or airways (respiratory infections). Allergies. Obesity. Diabetes. What are breast milk antibodies? Breast milk antibodies are substances that are part of the body's disease-fighting system (immune system). Antibodies help your baby fight off infections. Breast milk antibodies protect your baby from illnesses that you have: Had yourself. Been vaccinated against. Been exposed to while breastfeeding or pumping. What are other benefits of breastfeeding and bottle-feeding expressed breast milk? Breast milk is free. Breastfeeding may be convenient. In an emergency situation, formula shortage,  or natural disaster, you will be able to feed your baby without the need for safe water or infant formula. If you are exclusively pumping, manual breast pumps are available to express breast milk. You will need to boil pump parts to keep them clean between uses. If you do not have access to clean water or a manual breast pump, you can hand express your milk. Do this by compressing and massaging the milk ducts of each breast and catching the breast milk in a bottle or storage container. Where to find more information [employer]: llli.org Breastfeeding, Centers for Disease Control and Prevention (CDC): TonerPromos.no Breastfeeding and Returning to the Workplace, Marine scientist for Disease Control and Prevention (CDC): TonerPromos.no [employer] (ACOG) : acog.org WIC Breastfeeding Support: wicbreastfeeding.fns.usda.gov Contact a health care provider if: You are frustrated with breastfeeding. You are worried your baby is not getting enough milk. Signs of not getting enough milk include: Your baby is not gaining weight or loses weight. Your baby is more than 83 week old and wetting fewer than 6 diapers in a 24-hour period. You do not hear your baby swallowing during feeds. Your full breasts do not get softer after your baby breastfeeds. Your baby is frequently too sleepy to feed well or is crying and will not stop. You have pain or discomfort in your breasts such  as: Continued pain with breastfeeding. Breast engorgement that does not improve after 48-72 hours. Cracking or soreness in your nipples that does not get better with treatment. Bleeding from your nipples. This information is not intended to replace advice given to you by your health care provider. Make sure you discuss any questions you have with your health care provider. Document Revised: 03/21/2022 Document Reviewed: 02/22/2022 Elsevier Patient Education  2024 Elsevier Inc. Problems to Watch for  During Pregnancy During pregnancy, your body goes through many changes. Some changes may be uncomfortable. But most changes are not a serious problem. It's important to learn when certain signs and symptoms may be a problem. Talk with your health care provider about any medical conditions you have. Make sure you know the symptoms to watch for. Reporting problems early will prevent complications. Problems to watch for during pregnancy You're more likely to get an infection during pregnancy. Let your provider know if you have signs of infection, such as: A fever. A bad-smelling fluid from your vagina. Peeing too often, wanting to pee urgently, or pain when you pee. Also, let your provider know if: You're very tired, you feel dizzy, or you faint. You have watery poop (diarrhea) for 24 hours or longer. You throw up or feel like throwing up for 24 hours or longer. You have cramping in your belly or have pain in your hips or lower back. You have spotting, bleeding, or leaking of fluid from your vagina. You have pain, swelling, or redness in an arm or leg. You should also watch for signs of high blood pressure and preeclampsia. These signs can be very serious. They include: A headache that doesn't go away when you take medicine. Sudden or very bad swelling of your face, hands, legs, or feet. Problems seeing, such as: You see spots. You have blurry vision. You may be sensitive to light. Why it's important to watch for these problems Watching and reporting problems to your provider can help prevent complications that may affect you and your baby. These include: Higher risk of giving birth early. Infection that may be passed on to your baby. Higher risk for stillbirth. Follow these instructions at home:  Take your medicines only as told. Keep all follow-up visits. Your provider needs to monitor your health and your baby's health. Where to find more information To learn more, go to these  websites: Centers for Disease Control and Prevention (CDC) at TonerPromos.no. Then: Click Health Topics A-Z. Type urgent maternal warning signs in the search box. [employer]ians and Gynecologists (ACOG): acog.org Contact a health care provider if: You have any problems while you're pregnant. You feel your baby moving less than usual. You have any of these things: You have strong emotions, such as sadness or anxiety, that affect your daily life. You do not feel safe in your home. You use tobacco, alcohol, or drugs, and you need help to stop. Get help right away if: You faint, have a seizure, or cannot think clearly. You have chest pain or difficulty breathing. You have any of the following symptoms and you were unable to reach your provider: You have symptoms of infection, including a fever, or have vaginal bleeding. You have symptoms of high blood pressure or preeclampsia. You have signs or symptoms of labor before 37 weeks of pregnancy. These include: Contractions that are 5 minutes or less apart, or that increase in frequency, intensity, or length. Sudden, sharp pain in the belly, or low back pain. Any amount  of fluid that flows from your vagina without stopping. These symptoms may be an emergency. Call 911 right away. Do not wait to see if the symptoms will go away. Do not drive yourself to the hospital. This information is not intended to replace advice given to you by your health care provider. Make sure you discuss any questions you have with your health care provider. Document Revised: 08/13/2022 Document Reviewed: 08/13/2022 Elsevier Patient Education  2024 [employer].

## 2023-10-01 ENCOUNTER — Other Ambulatory Visit: Payer: Self-pay

## 2023-10-01 DIAGNOSIS — O3680X1 Pregnancy with inconclusive fetal viability, fetus 1: Secondary | ICD-10-CM

## 2023-10-01 LAB — CERVICOVAGINAL ANCILLARY ONLY
Bacterial Vaginitis (gardnerella): POSITIVE — AB
Candida Glabrata: NEGATIVE
Candida Vaginitis: NEGATIVE
Chlamydia: POSITIVE — AB
Comment: NEGATIVE
Comment: NEGATIVE
Comment: NEGATIVE
Comment: NEGATIVE
Comment: NEGATIVE
Comment: NORMAL
Neisseria Gonorrhea: NEGATIVE
Trichomonas: NEGATIVE

## 2023-10-01 MED ORDER — PROMETHAZINE HCL 25 MG PO TABS: 25.0000 mg | ORAL_TABLET | Freq: Four times a day (QID) | ORAL | 2 refills | Status: DC | PRN

## 2023-10-01 MED ORDER — ONDANSETRON 4 MG PO TBDP: 4.0000 mg | ORAL_TABLET | Freq: Three times a day (TID) | ORAL | 1 refills | Status: DC | PRN

## 2023-10-01 MED ORDER — ASPIRIN 81 MG PO TBEC: 81.0000 mg | DELAYED_RELEASE_TABLET | Freq: Every day | ORAL | 2 refills | Status: AC

## 2023-10-01 MED ORDER — FAMOTIDINE 20 MG PO TABS: 20.0000 mg | ORAL_TABLET | Freq: Two times a day (BID) | ORAL | 3 refills | Status: DC

## 2023-10-01 NOTE — Progress Notes (Signed)
 Dating scan ordered for f/u viability to check for heart tones scheduled 10/02/23

## 2023-10-02 ENCOUNTER — Other Ambulatory Visit

## 2023-10-02 DIAGNOSIS — O3680X1 Pregnancy with inconclusive fetal viability, fetus 1: Secondary | ICD-10-CM

## 2023-10-02 DIAGNOSIS — Z3A08 8 weeks gestation of pregnancy: Secondary | ICD-10-CM | POA: Diagnosis not present

## 2023-10-02 LAB — MONITOR DRUG PROFILE 14(MW)
Amphetamine Scrn, Ur: NEGATIVE ng/mL
BARBITURATE SCREEN URINE: NEGATIVE ng/mL
BENZODIAZEPINE SCREEN, URINE: NEGATIVE ng/mL
Buprenorphine, Urine: NEGATIVE ng/mL
CANNABINOIDS UR QL SCN: NEGATIVE ng/mL
Cocaine (Metab) Scrn, Ur: NEGATIVE ng/mL
Creatinine(Crt), U: 161.5 mg/dL (ref 20.0–300.0)
Fentanyl, Urine: NEGATIVE pg/mL
Meperidine Screen, Urine: NEGATIVE ng/mL
Methadone Screen, Urine: NEGATIVE ng/mL
OXYCODONE+OXYMORPHONE UR QL SCN: NEGATIVE ng/mL
Opiate Scrn, Ur: NEGATIVE ng/mL
Ph of Urine: 5.5 (ref 4.5–8.9)
Phencyclidine Qn, Ur: NEGATIVE ng/mL
Propoxyphene Scrn, Ur: NEGATIVE ng/mL
SPECIFIC GRAVITY: 1.021
Tramadol Screen, Urine: NEGATIVE ng/mL

## 2023-10-02 LAB — URINALYSIS, ROUTINE W REFLEX MICROSCOPIC
Bilirubin, UA: NEGATIVE
Glucose, UA: NEGATIVE
Leukocytes,UA: NEGATIVE
Nitrite, UA: NEGATIVE
RBC, UA: NEGATIVE
Specific Gravity, UA: 1.027 (ref 1.005–1.030)
Urobilinogen, Ur: 0.2 mg/dL (ref 0.2–1.0)
pH, UA: 6 (ref 5.0–7.5)

## 2023-10-02 LAB — NICOTINE SCREEN, URINE: Cotinine Ql Scrn, Ur: POSITIVE ng/mL — AB

## 2023-10-02 LAB — PROTEIN / CREATININE RATIO, URINE
Creatinine, Urine: 156.4 mg/dL
Protein, Ur: 17.4 mg/dL
Protein/Creat Ratio: 111 mg/g{creat} (ref 0–200)

## 2023-10-03 ENCOUNTER — Ambulatory Visit: Payer: Self-pay | Admitting: Certified Nurse Midwife

## 2023-10-03 MED ORDER — METRONIDAZOLE 500 MG PO TABS: 500.0000 mg | ORAL_TABLET | Freq: Two times a day (BID) | ORAL | 0 refills | Status: AC

## 2023-10-03 MED ORDER — AZITHROMYCIN 500 MG PO TABS: 1000.0000 mg | ORAL_TABLET | Freq: Once | ORAL | 0 refills | Status: AC

## 2023-10-04 LAB — URINE CULTURE, OB REFLEX

## 2023-10-04 LAB — CULTURE, OB URINE

## 2023-10-06 MED ORDER — NITROFURANTOIN MONOHYD MACRO 100 MG PO CAPS: 100.0000 mg | ORAL_CAPSULE | Freq: Two times a day (BID) | ORAL | 1 refills | Status: DC

## 2023-10-06 NOTE — Addendum Note (Signed)
 Addended by: JAYNE RAISIN on: 10/06/2023 08:15 AM   Modules accepted: Orders

## 2023-10-14 ENCOUNTER — Other Ambulatory Visit

## 2023-10-14 DIAGNOSIS — Z113 Encounter for screening for infections with a predominantly sexual mode of transmission: Secondary | ICD-10-CM | POA: Diagnosis not present

## 2023-10-14 DIAGNOSIS — Z1379 Encounter for other screening for genetic and chromosomal anomalies: Secondary | ICD-10-CM

## 2023-10-14 DIAGNOSIS — Z3480 Encounter for supervision of other normal pregnancy, unspecified trimester: Secondary | ICD-10-CM | POA: Diagnosis not present

## 2023-10-14 DIAGNOSIS — Z131 Encounter for screening for diabetes mellitus: Secondary | ICD-10-CM

## 2023-10-14 DIAGNOSIS — Z8759 Personal history of other complications of pregnancy, childbirth and the puerperium: Secondary | ICD-10-CM

## 2023-10-14 DIAGNOSIS — Z369 Encounter for antenatal screening, unspecified: Secondary | ICD-10-CM

## 2023-10-14 DIAGNOSIS — Z0184 Encounter for antibody response examination: Secondary | ICD-10-CM | POA: Diagnosis not present

## 2023-10-15 ENCOUNTER — Encounter: Payer: Self-pay | Admitting: Certified Nurse Midwife

## 2023-10-15 LAB — CBC/D/PLT+RPR+RH+ABO+RUBIGG...
Antibody Screen: NEGATIVE
Basophils Absolute: 0 x10E3/uL (ref 0.0–0.2)
Basos: 0 %
EOS (ABSOLUTE): 0 x10E3/uL (ref 0.0–0.4)
Eos: 0 %
HCV Ab: NONREACTIVE
HIV Screen 4th Generation wRfx: NONREACTIVE
Hematocrit: 40.5 % (ref 34.0–46.6)
Hemoglobin: 13.2 g/dL (ref 11.1–15.9)
Hepatitis B Surface Ag: NEGATIVE
Immature Grans (Abs): 0 x10E3/uL (ref 0.0–0.1)
Immature Granulocytes: 0 %
Lymphocytes Absolute: 2.2 x10E3/uL (ref 0.7–3.1)
Lymphs: 30 %
MCH: 28.4 pg (ref 26.6–33.0)
MCHC: 32.6 g/dL (ref 31.5–35.7)
MCV: 87 fL (ref 79–97)
Monocytes Absolute: 0.5 x10E3/uL (ref 0.1–0.9)
Monocytes: 7 %
Neutrophils Absolute: 4.6 x10E3/uL (ref 1.4–7.0)
Neutrophils: 63 %
Platelets: 214 x10E3/uL (ref 150–450)
RBC: 4.65 x10E6/uL (ref 3.77–5.28)
RDW: 15.5 % — ABNORMAL HIGH (ref 11.7–15.4)
RPR Ser Ql: NONREACTIVE
Rh Factor: NEGATIVE
Rubella Antibodies, IGG: 6.29 {index} (ref >0.99)
Varicella zoster IgG: REACTIVE
WBC: 7.4 x10E3/uL (ref 3.4–10.8)

## 2023-10-15 LAB — COMPREHENSIVE METABOLIC PANEL WITH GFR
ALT: 108 IU/L — ABNORMAL HIGH (ref 0–32)
AST: 33 IU/L (ref 0–40)
Albumin: 4.3 g/dL (ref 4.0–5.0)
Alkaline Phosphatase: 118 IU/L (ref 44–121)
BUN/Creatinine Ratio: 20 (ref 9–23)
BUN: 9 mg/dL (ref 6–20)
Bilirubin Total: <0.2 mg/dL (ref 0.0–1.2)
CO2: 18 mmol/L — ABNORMAL LOW (ref 20–29)
Calcium: 9.5 mg/dL (ref 8.7–10.2)
Chloride: 102 mmol/L (ref 96–106)
Creatinine, Ser: 0.44 mg/dL — ABNORMAL LOW (ref 0.57–1.00)
Globulin, Total: 2.8 g/dL (ref 1.5–4.5)
Glucose: 84 mg/dL (ref 70–99)
Potassium: 4.2 mmol/L (ref 3.5–5.2)
Sodium: 134 mmol/L (ref 134–144)
Total Protein: 7.1 g/dL (ref 6.0–8.5)
eGFR: 141 mL/min/1.73 (ref >59)

## 2023-10-15 LAB — TSH+FREE T4
Free T4: 1.03 ng/dL (ref 0.82–1.77)
TSH: 0.648 u[IU]/mL (ref 0.450–4.500)

## 2023-10-15 LAB — HEMOGLOBIN A1C
Est. average glucose Bld gHb Est-mCnc: 100 mg/dL
Hgb A1c MFr Bld: 5.1 % (ref 4.8–5.6)

## 2023-10-15 LAB — HCV INTERPRETATION

## 2023-10-18 LAB — MATERNIT 21 PLUS CORE, BLOOD
Fetal Fraction: 18
Result (T21): NEGATIVE
Trisomy 13 (Patau syndrome): NEGATIVE
Trisomy 18 (Edwards syndrome): NEGATIVE
Trisomy 21 (Down syndrome): NEGATIVE

## 2023-10-19 ENCOUNTER — Other Ambulatory Visit: Payer: Self-pay | Admitting: Certified Nurse Midwife

## 2023-10-19 MED ORDER — NITROFURANTOIN MONOHYD MACRO 100 MG PO CAPS: 100.0000 mg | ORAL_CAPSULE | Freq: Every day | ORAL | 1 refills | Status: DC

## 2023-10-19 NOTE — Progress Notes (Signed)
 Daily nitrofurantoin  ordered due to pyelonephritis and uti in first trimester.

## 2023-10-20 ENCOUNTER — Encounter: Payer: Self-pay | Admitting: Family Medicine

## 2023-10-27 ENCOUNTER — Encounter: Admitting: Family Medicine

## 2023-10-28 ENCOUNTER — Encounter: Admitting: Certified Nurse Midwife

## 2023-10-29 ENCOUNTER — Encounter: Payer: Self-pay | Admitting: Certified Nurse Midwife

## 2023-11-03 ENCOUNTER — Encounter: Admitting: Licensed Practical Nurse

## 2023-11-03 DIAGNOSIS — Z888 Allergy status to other drugs, medicaments and biological substances status: Secondary | ICD-10-CM | POA: Diagnosis not present

## 2023-11-03 DIAGNOSIS — Z3A13 13 weeks gestation of pregnancy: Secondary | ICD-10-CM | POA: Diagnosis not present

## 2023-11-03 DIAGNOSIS — Z3A22 22 weeks gestation of pregnancy: Secondary | ICD-10-CM | POA: Diagnosis not present

## 2023-11-03 DIAGNOSIS — O212 Late vomiting of pregnancy: Secondary | ICD-10-CM | POA: Diagnosis not present

## 2023-11-03 DIAGNOSIS — Z91018 Allergy to other foods: Secondary | ICD-10-CM | POA: Diagnosis not present

## 2023-11-03 DIAGNOSIS — O219 Vomiting of pregnancy, unspecified: Secondary | ICD-10-CM | POA: Diagnosis not present

## 2023-11-03 NOTE — ED Provider Notes (Signed)
 [facility] Emergency Department Progress Note  November 03, 2023 7:15 PM     ED care from Elsie Beams, FNP at 7:00 PM. Faith [name] is a G95P0101 23 y.o. female (~[redacted] weeks gestation) with a past medical history of preeclampsia and GERD who presented to the ED for evaluation of 2 weeks of difficulty tolerating PO intake 2/2 nausea and L sided abdominal pain, unrelieved by phenergan  at home.   Exam by previous provider was unremarkable. Labs notable for transaminitis with AST of 50, ALT of 78, and Alk Phos of 125. Creatinine decreased to 0.52; BUN 6. CBC, lipase, magnesium, LDH, and uric acid unremarkable. UA with small leukocyte esterases, 12 WBCs, and >150 ketones. Negative nitrites, no bacteria. No hematuria. hCG 36,026.3.   The patient was given 1L of fluids, Zofran , and vitamin B6. Pending symptom control and PO challenge. If patient continues to feel better in one hour and is able to tolerate PO, anticipate discharge.    Medical Decision Making and Progress Notes   7:39 PM Per RN, the patient was able to tolerate PO intake. Will perform a bedside ultrasound.   8:00 PM Ultrasound showing fetal heart tones of 160 BPM. Will start infusion of D5 and continue to PO challenge.   10:06 PM Patient is tolerating PO (ginger ale and chips) upon re-evaluation. Feel it is appropriate to discharge patient at this time. Return precautions were explained, along with follow up instructions. The patient is comfortable with the plan and expresses understanding. Discharged home in good condition.   Discussion of Management with other Physicians, QHP or Appropriate Source: None Independent Interpretation of Studies: None External Records Reviewed: Patient's most recent outpatient clinic note - 09/30/23 [facility] Note for past medical history Escalation of Care, Consideration of Admission/Observation/Transfer: None Social determinants that significantly affected  care: None applicable Prescription drug(s) considered but not prescribed: None Diagnostic tests considered but not performed: Considered TVUS, but elected to defer given recent ultrasounds on 10/02/23 and 09/18/23 showing viable IUP. History obtained from other sources: None  Portions of this record have been created using Scientist, clinical (histocompatibility and immunogenetics). Dictation errors have been sought, but may not have been identified and corrected.    ED Clinical Impression    Final diagnoses:  Nausea and vomiting during pregnancy prior to [redacted] weeks gestation ([facility]HS-HCC) (Primary)   Documentation assistance was provided by Burton Picking, Scribe, on November 03, 2023 at 7:19 PM for Hoy Slade, MD.  November 12, 2023 8:30 AM. Documentation assistance provided by the scribe. I was present during the time the encounter was recorded. The information recorded by the scribe was done at my direction and has been reviewed and validated by me.

## 2023-11-12 ENCOUNTER — Other Ambulatory Visit (HOSPITAL_COMMUNITY)
Admission: RE | Admit: 2023-11-12 | Discharge: 2023-11-12 | Disposition: A | Discharge status: Home / Self Care | Source: Ambulatory Visit | Attending: Certified Nurse Midwife | Admitting: Certified Nurse Midwife

## 2023-11-12 ENCOUNTER — Encounter: Payer: Self-pay | Admitting: Certified Nurse Midwife

## 2023-11-12 ENCOUNTER — Ambulatory Visit (INDEPENDENT_AMBULATORY_CARE_PROVIDER_SITE_OTHER): Admitting: Certified Nurse Midwife

## 2023-11-12 VITALS — BP 124/87 | HR 116 | Wt 156.0 lb

## 2023-11-12 DIAGNOSIS — O99612 Diseases of the digestive system complicating pregnancy, second trimester: Secondary | ICD-10-CM

## 2023-11-12 DIAGNOSIS — Z3480 Encounter for supervision of other normal pregnancy, unspecified trimester: Secondary | ICD-10-CM | POA: Diagnosis not present

## 2023-11-12 DIAGNOSIS — Z3689 Encounter for other specified antenatal screening: Secondary | ICD-10-CM | POA: Insufficient documentation

## 2023-11-12 DIAGNOSIS — O26892 Other specified pregnancy related conditions, second trimester: Secondary | ICD-10-CM | POA: Insufficient documentation

## 2023-11-12 DIAGNOSIS — Z113 Encounter for screening for infections with a predominantly sexual mode of transmission: Secondary | ICD-10-CM | POA: Diagnosis not present

## 2023-11-12 DIAGNOSIS — N898 Other specified noninflammatory disorders of vagina: Secondary | ICD-10-CM

## 2023-11-12 DIAGNOSIS — Z3A14 14 weeks gestation of pregnancy: Secondary | ICD-10-CM | POA: Diagnosis not present

## 2023-11-12 DIAGNOSIS — Z8759 Personal history of other complications of pregnancy, childbirth and the puerperium: Secondary | ICD-10-CM

## 2023-11-12 DIAGNOSIS — Z3482 Encounter for supervision of other normal pregnancy, second trimester: Secondary | ICD-10-CM

## 2023-11-12 DIAGNOSIS — R42 Dizziness and giddiness: Secondary | ICD-10-CM | POA: Diagnosis not present

## 2023-11-12 DIAGNOSIS — H9193 Unspecified hearing loss, bilateral: Secondary | ICD-10-CM

## 2023-11-12 DIAGNOSIS — T7840XD Allergy, unspecified, subsequent encounter: Secondary | ICD-10-CM

## 2023-11-12 DIAGNOSIS — O219 Vomiting of pregnancy, unspecified: Secondary | ICD-10-CM

## 2023-11-12 MED ORDER — DOCUSATE SODIUM 100 MG PO CAPS: 100.0000 mg | ORAL_CAPSULE | Freq: Two times a day (BID) | ORAL | 4 refills | Status: DC | PRN

## 2023-11-12 MED ORDER — POLYETHYLENE GLYCOL 3350 17 G PO PACK: 17.0000 g | PACK | Freq: Every day | ORAL | 11 refills | Status: DC | PRN

## 2023-11-12 MED ORDER — PROMETHAZINE HCL 25 MG RE SUPP: 25.0000 mg | Freq: Four times a day (QID) | RECTAL | 4 refills | Status: DC | PRN

## 2023-11-12 NOTE — Assessment & Plan Note (Signed)
 Red flag symptoms reviewed. Comfort measures for constipation and prescriptions for colace & miralax  provided. Phenergan  suppository refilled. Will follow up via MyChart with TOC results for swab & urine. Anatomy ultrasound ordered.

## 2023-11-12 NOTE — Progress Notes (Addendum)
    Return Prenatal Note   Subjective   23 y.o. G2P0101 at [redacted]w[redacted]d presents for this follow-up prenatal visit.  Patient seen in ED 8/18 for nausea/vomiting. Phenergan  suppositories are helping, needs refill. Urine for test of cure & Aptima self collected. Continues on daily macrobid  for suppression. She has had several episodes of pink tinged discharge, happens 1-2 times a week, no bright red bleeding. Reports constipation as well, with BM firm & only ~2x/week. Unsure if discharge increases after straining. Patient reports: Movement: Absent Contractions: Not present  Objective   Flow sheet Vitals: Pulse Rate: (!) 116 BP: 124/87 Fetal Heart Rate (bpm): 155 Total weight gain: -4 lb (-1.814 kg)  General Appearance  No acute distress, well appearing, and well nourished Pulmonary   Normal work of breathing Neurologic   Alert and oriented to person, place, and time Psychiatric   Mood and affect within normal limits   Assessment/Plan   Plan  22 y.o. G2P0101 at [redacted]w[redacted]d presents for follow-up OB visit. Reviewed prenatal record including previous visit note.  Supervision of other normal pregnancy, antepartum Red flag symptoms reviewed. Comfort measures for constipation and prescriptions for colace & miralax  provided. Phenergan  suppository refilled. Will follow up via MyChart with TOC results for swab & urine. Anatomy ultrasound ordered.      Orders Placed This Encounter  Procedures   Urine Culture   US  OB Comp + 14 Wk    Standing Status:   Future    Expiration Date:   01/11/2025    Reason for Exam (SYMPTOM  OR DIAGNOSIS REQUIRED):   anatomy    Preferred Imaging Location?:   Internal   Return in 4 weeks (on 12/10/2023) for ROB, anatomy ultrasound-6 weeks.   Future Appointments  Date Time Provider Department Center  12/10/2023  1:55 PM Lynda Bradley, CNM [facility] None  12/24/2023  1:00 PM [facility] US  1 [facility] None    For next visit:  continue with routine prenatal care      Harlene LITTIE Cisco, CNM  08/27/254:24 PM

## 2023-11-12 NOTE — Patient Instructions (Signed)
 Second Trimester of Pregnancy  The second trimester of pregnancy is from week 14 through week 27. This is months 4 through 6 of pregnancy. During the second trimester: Morning sickness is less or has stopped. You may have more energy. You may feel hungry more often. At this time, your unborn baby is growing very fast. At the end of the sixth month, the unborn baby may be up to 12 inches long and weigh about 1 pounds. You will likely start to feel the baby move between 16 and 20 weeks of pregnancy. Body changes during your second trimester Your body continues to change during this time. The changes usually go away after your baby is born. Physical changes You will gain more weight. Your belly will get bigger. You may begin to get stretch marks on your hips, belly, and breasts. Your breasts will keep growing and may hurt. You may get dark spots or blotches on your face. A dark line from your belly button to the pubic area may appear. This line is called linea nigra. Your hair may grow faster and get thicker. Health changes You may have headaches. You may have heartburn. You may pee more often. You may have swollen, bulging veins (varicose veins). You may have trouble pooping (constipation), or swollen veins in the butt that can itch or get painful (hemorrhoids). You may have back pain. This is caused by: Weight gain. Pregnancy hormones that are relaxing the joints in your pelvis. Follow these instructions at home: Medicines Talk to your health care provider if you're taking medicines. Ask if the medicines are safe to take during pregnancy. Your provider may change the medicines that you take. Do not take any medicines unless told to by your provider. Take a prenatal vitamin that has at least 600 micrograms (mcg) of folic acid. Do not use herbal medicines, illegal drugs, or medicines that are not approved by your provider. Eating and drinking While you're pregnant your body needs  extra food for your growing baby. Talk with your provider about what to eat while pregnant. Activity Most women are able to exercise during pregnancy. Exercises may need to change as your pregnancy goes on. Talk to your provider about your activities and exercise routines. Relieving pain and discomfort Wear a good, supportive bra if your breasts hurt. Rest with your legs raised if you have leg cramps or low back pain. Take warm sitz baths to soothe pain from hemorrhoids. Use hemorrhoid cream if your provider says it's okay. Do not douche. Do not use tampons or scented pads. Do not use hot tubs, steam rooms, or saunas. Safety Wear your seatbelt at all times when you're in a car. Talk to your provider if someone hits you, hurts you, or yells at you. Talk with your provider if you're feeling sad or have thoughts of hurting yourself. Lifestyle Certain things can be harmful while you're pregnant. It's best to avoid the following: Do not drink alcohol,smoke, vape, or use products with nicotine or tobacco in them. If you need help quitting, talk with your provider. Avoid cat litter boxes and soil used by cats. These things carry germs that can cause harm to your pregnancy and your baby. General instructions Keep all follow-up visits. It helps you and your unborn baby stay as healthy as possible. Write down your questions. Take them to your prenatal visits. Your provider will: Talk with you about your overall health. Give you advice or refer you to specialists who can help with different needs,  including: Prenatal education classes. Mental health and counseling. Foods and healthy eating. Ask for help if you need help with food. Where to find more information American Pregnancy Association: americanpregnancy.org [employer]necologists: acog.org Office on [employer] Health: TravelLesson.ca Contact a health care provider if: You have a headache that does not go away  when you take medicine. You have any of these problems: You can't eat or drink. You throw up or feel like you may throw up. You have watery poop (diarrhea) for 2 days or more. You have pain when you pee or your pee smells bad. You have been sick for 2 days or more and are not getting better. Contact your provider right away if: You have any of these coming from your vagina: Abnormal discharge. Bad-smelling fluid. Bleeding. Your baby is moving less than usual. You have contractions, belly cramping, or have pain in your pelvis or lower back. You have symptoms of high blood pressure or preeclampsia. These include: A severe, throbbing headache that does not go away. Sudden or extreme swelling of your face, hands, legs, or feet. Vision problems: You see spots. You have blurry vision. Your eyes are sensitive to light. If you can't reach the provider, go to an urgent care or emergency room. Get help right away if: You faint, become confused, or can't think clearly. You have chest pain or trouble breathing. You have any kind of injury, such as from a fall or a car crash. These symptoms may be an emergency. Call 911 right away. Do not wait to see if the symptoms will go away. Do not drive yourself to the hospital. This information is not intended to replace advice given to you by your health care provider. Make sure you discuss any questions you have with your health care provider. Document Revised: 12/05/2022 Document Reviewed: 07/05/2022 Elsevier Patient Education  2024 [employer].

## 2023-11-14 LAB — URINE CULTURE: Organism ID, Bacteria: NO GROWTH

## 2023-11-14 LAB — CERVICOVAGINAL ANCILLARY ONLY
Bacterial Vaginitis (gardnerella): POSITIVE — AB
Candida Glabrata: NEGATIVE
Candida Vaginitis: NEGATIVE
Chlamydia: NEGATIVE
Comment: NEGATIVE
Comment: NEGATIVE
Comment: NEGATIVE
Comment: NEGATIVE
Comment: NEGATIVE
Comment: NORMAL
Neisseria Gonorrhea: NEGATIVE
Trichomonas: NEGATIVE

## 2023-11-15 ENCOUNTER — Ambulatory Visit: Payer: Self-pay | Admitting: Certified Nurse Midwife

## 2023-11-15 MED ORDER — METRONIDAZOLE 500 MG PO TABS: 500.0000 mg | ORAL_TABLET | Freq: Two times a day (BID) | ORAL | 0 refills | Status: AC

## 2023-11-26 ENCOUNTER — Other Ambulatory Visit: Payer: Self-pay | Admitting: Medical Genetics

## 2023-12-10 ENCOUNTER — Ambulatory Visit: Admitting: Advanced Practice Midwife

## 2023-12-10 ENCOUNTER — Encounter: Payer: Self-pay | Admitting: Advanced Practice Midwife

## 2023-12-10 VITALS — BP 118/78 | HR 116 | Wt 156.8 lb

## 2023-12-10 DIAGNOSIS — Z3482 Encounter for supervision of other normal pregnancy, second trimester: Secondary | ICD-10-CM

## 2023-12-10 DIAGNOSIS — Z1379 Encounter for other screening for genetic and chromosomal anomalies: Secondary | ICD-10-CM

## 2023-12-10 DIAGNOSIS — Z3A18 18 weeks gestation of pregnancy: Secondary | ICD-10-CM | POA: Diagnosis not present

## 2023-12-10 DIAGNOSIS — Z3480 Encounter for supervision of other normal pregnancy, unspecified trimester: Secondary | ICD-10-CM

## 2023-12-10 NOTE — Progress Notes (Signed)
 Routine Prenatal Care Visit  Subjective  Faith [name] is a 23 y.o. G2P0101 at [redacted]w[redacted]d being seen today for ongoing prenatal care.  She is currently monitored for the following issues for this low-risk pregnancy and has Allergy; Acne vulgaris; Hearing loss; Vertigo; Chronic migraine without aura without status migrainosus, not intractable; History of pre-eclampsia; and Supervision of other normal pregnancy, antepartum on their problem list.  ----------------------------------------------------------------------------------- Patient reports feeling well most days. Still has occasional nausea. Admits appetite is so so. Discussed trying to have smaller more frequent meals- focus on healthy foods.  AFP today. Has anatomy scheduled on 10/7. Contractions: Not present. Vag. Bleeding: None.  Movement: Present. Leaking Fluid denies.  ----------------------------------------------------------------------------------- The following portions of the patient's history were reviewed and updated as appropriate: allergies, current medications, past family history, past medical history, past social history, past surgical history and problem list. Problem list updated.  Objective  BP 118/78   Pulse (!) 116   Wt 156 lb 12.8 oz (71.1 kg)   LMP 08/04/2023   BMI 26.91 kg/m  Pregravid weight 160 lb (72.6 kg) Total Weight Gain -3 lb 3.2 oz (-1.452 kg) Urinalysis: Urine Protein    Urine Glucose    Fetal Status: Fetal Heart Rate (bpm): 158   Movement: Present      General:  Alert, oriented and cooperative. Patient is in no acute distress.  Skin: Skin is warm and dry. No rash noted.   Cardiovascular: Normal heart rate noted  Respiratory: Normal respiratory effort, no problems with respiration noted  Abdomen: Soft, gravid, appropriate for gestational age. Pain/Pressure: Absent     Pelvic:  Cervical exam deferred        Extremities: Normal range of motion.  Edema: None  Mental Status: Normal mood and affect.  Normal behavior. Normal judgment and thought content.   Assessment   23 y.o. G2P0101 at [redacted]w[redacted]d by  05/10/2024, by Last Menstrual Period presenting for routine prenatal visit  Plan   SECOND Problems (from 09/12/23 to present)     Problem Noted Diagnosed Resolved   Supervision of other normal pregnancy, antepartum 09/12/2023 by Loralyn Sander, CMA  No   Overview Addendum 12/10/2023  1:58 PM by Taft Salines, LPN   Clinical Staff Provider  Office Location  [facility] Dating  BY LMP  Language  English Anatomy US     Flu Vaccine  Declined Genetic Screen  NIPS: WNL  TDaP vaccine   Offer Hgb A1C or  GTT Early : Third trimester :   Covid X2-Moderna   LAB RESULTS   Rhogam  O/Negative/-- (07/29 1419)  Blood Type O/Negative/-- (07/29 1419)   RSV Offer Antibody Negative (07/29 1419)  Feeding Plan Formula Rubella 6.29 (07/29 1419)  Contraception IUD RPR Non Reactive (07/29 1419)   Circumcision Yes HBsAg Negative (07/29 1419)   Pediatrician  [facility] HIV Non Reactive (07/29 1419)  Support Person FOB Varicella Reactive (07/29 1419)  Prenatal Classes No GBS  (For PCN allergy, check sensitivities)     Hep C Non Reactive (07/29 1419)   BTL Consent  Pap No results found for: DIAGPAP  VBAC Consent  Hgb Electro      CF      SMA                    Preterm labor symptoms and general obstetric precautions including but not limited to vaginal bleeding, contractions, leaking of fluid and fetal movement were reviewed in detail with the patient. Please refer  to After Visit Summary for other counseling recommendations.   Return in about 2 weeks (around 12/24/2023) for rob after scheduled anatomy on 10/7.  Slater Rains, CNM 12/10/2023 2:20 PM

## 2023-12-10 NOTE — Patient Instructions (Signed)
 Alpha-Fetoprotein Test: What to Know Why am I having this test? The alpha-fetoprotein (AFP) test is a blood test used to show if a pregnant person is at risk of carrying a baby with a congenital condition. A congenital condition is a problem that a baby is born with. The AFP test can be combined with other tests to look for these problems in the unborn baby: Genetic problems. Problems with the brain or spinal cord. Belly problems. This test is used to see if your baby is at risk for having a congenital condition. It doesn't show if your baby actually has the condition. More testing will be needed to know for sure. The AFP test may also be done for males or nonpregnant people to check for certain cancers. What is being tested? This test measures the amount of AFP in your blood. AFP is a protein that's normally found in your blood starting in the 10th week of pregnancy. The level of AFP is highest at 16-18 weeks of pregnancy. AFP testing can be done between 15 and 20 weeks of pregnancy, but 16 to 18 weeks of pregnancy is the best time to do it. Certain cancers can cause a high level of AFP in both males and females. What kind of sample is taken?  A blood sample is needed for this test. It's usually taken by putting a needle into a blood vessel. How are the results reported? Your results will be compared with normal results for this test. Each lab has its own range for what's normal. Most lab reports include the normal ranges for each test and a note telling you if yours is high or low. Normal results for the AFP test tend to be: Adult: Less than 40 ng/mL or less than 40 mcg/L (SI units). Child younger than 1 year: Less than 30 ng/mL. If you're pregnant, the values will change based on how far along you are. What do the results mean? If you're pregnant and your results are lower than expected, it can mean that your due date isn't right or that your baby may have trisomy 6, also known as Down  syndrome. If you're pregnant and your results are higher than expected, it may mean: You're pregnant with more than one baby. Your due date is not right. Your baby may have: A problem with the wall of the belly. A problem with the spinal cord or brain. Your baby isn't getting enough oxygen, or the heart rate is too fast or too slow. Your baby has died before being born. Results that are higher than expected in males or nonpregnant females may indicate: Cancer. Liver cell death. Talk with your health care provider about what your results mean. AFP testing is not used alone. Other testing will be needed. Questions to ask your health care provider Ask your provider, or the department that's doing the test: When will my results be ready? How will I get my results? What other tests do I need? What are my next steps? This information is not intended to replace advice given to you by your health care provider. Make sure you discuss any questions you have with your health care provider. Document Revised: 01/15/2023 Document Reviewed: 01/15/2023 Elsevier Patient Education  2025 [employer]. Second Trimester of Pregnancy  The second trimester of pregnancy is from week 14 through week 27. This is months 4 through 6 of pregnancy. During the second trimester: Morning sickness is less or has stopped. You may have more energy. You may  feel hungry more often. At this time, your unborn baby is growing very fast. At the end of the sixth month, the unborn baby may be up to 12 inches long and weigh about 1 pounds. You will likely start to feel the baby move between 16 and 20 weeks of pregnancy. Body changes during your second trimester Your body continues to change during this time. The changes usually go away after your baby is born. Physical changes You will gain more weight. Your belly will get bigger. You may begin to get stretch marks on your hips, belly, and breasts. Your breasts will keep  growing and may hurt. You may get dark spots or blotches on your face. A dark line from your belly button to the pubic area may appear. This line is called linea nigra. Your hair may grow faster and get thicker. Health changes You may have headaches. You may have heartburn. You may pee more often. You may have swollen, bulging veins (varicose veins). You may have trouble pooping (constipation), or swollen veins in the butt that can itch or get painful (hemorrhoids). You may have back pain. This is caused by: Weight gain. Pregnancy hormones that are relaxing the joints in your pelvis. Follow these instructions at home: Medicines Talk to your health care provider if you're taking medicines. Ask if the medicines are safe to take during pregnancy. Your provider may change the medicines that you take. Do not take any medicines unless told to by your provider. Take a prenatal vitamin that has at least 600 micrograms (mcg) of folic acid. Do not use herbal medicines, illegal drugs, or medicines that are not approved by your provider. Eating and drinking While you're pregnant your body needs extra food for your growing baby. Talk with your provider about what to eat while pregnant. Activity Most women are able to exercise during pregnancy. Exercises may need to change as your pregnancy goes on. Talk to your provider about your activities and exercise routines. Relieving pain and discomfort Wear a good, supportive bra if your breasts hurt. Rest with your legs raised if you have leg cramps or low back pain. Take warm sitz baths to soothe pain from hemorrhoids. Use hemorrhoid cream if your provider says it's okay. Do not douche. Do not use tampons or scented pads. Do not use hot tubs, steam rooms, or saunas. Safety Wear your seatbelt at all times when you're in a car. Talk to your provider if someone hits you, hurts you, or yells at you. Talk with your provider if you're feeling sad or have  thoughts of hurting yourself. Lifestyle Certain things can be harmful while you're pregnant. It's best to avoid the following: Do not drink alcohol,smoke, vape, or use products with nicotine  or tobacco in them. If you need help quitting, talk with your provider. Avoid cat litter boxes and soil used by cats. These things carry germs that can cause harm to your pregnancy and your baby. General instructions Keep all follow-up visits. It helps you and your unborn baby stay as healthy as possible. Write down your questions. Take them to your prenatal visits. Your provider will: Talk with you about your overall health. Give you advice or refer you to specialists who can help with different needs, including: Prenatal education classes. Mental health and counseling. Foods and healthy eating. Ask for help if you need help with food. Where to find more information American Pregnancy Association: americanpregnancy.org [employer]: acog.org Office on [employer] Health: [employer]  Contact a health care provider if: You have a headache that does not go away when you take medicine. You have any of these problems: You can't eat or drink. You throw up or feel like you may throw up. You have watery poop (diarrhea) for 2 days or more. You have pain when you pee or your pee smells bad. You have been sick for 2 days or more and are not getting better. Contact your provider right away if: You have any of these coming from your vagina: Abnormal discharge. Bad-smelling fluid. Bleeding. Your baby is moving less than usual. You have contractions, belly cramping, or have pain in your pelvis or lower back. You have symptoms of high blood pressure or preeclampsia. These include: A severe, throbbing headache that does not go away. Sudden or extreme swelling of your face, hands, legs, or feet. Vision problems: You see spots. You have blurry vision. Your eyes are  sensitive to light. If you can't reach the provider, go to an urgent care or emergency room. Get help right away if: You faint, become confused, or can't think clearly. You have chest pain or trouble breathing. You have any kind of injury, such as from a fall or a car crash. These symptoms may be an emergency. Call 911 right away. Do not wait to see if the symptoms will go away. Do not drive yourself to the hospital. This information is not intended to replace advice given to you by your health care provider. Make sure you discuss any questions you have with your health care provider. Document Revised: 12/05/2022 Document Reviewed: 07/05/2022 Elsevier Patient Education  2024 [employer].

## 2023-12-12 LAB — AFP, SERUM, OPEN SPINA BIFIDA
AFP MoM: 0.7
AFP Value: 30.3 ng/mL
Gest. Age on Collection Date: 18.2 wk
Maternal Age At EDD: 22.4 a
OSBR Risk 1 IN: 10000
Test Results:: NEGATIVE
Weight: 156 [lb_av]

## 2023-12-15 ENCOUNTER — Ambulatory Visit: Payer: Self-pay | Admitting: Advanced Practice Midwife

## 2023-12-22 ENCOUNTER — Encounter: Payer: Self-pay | Admitting: Certified Nurse Midwife

## 2023-12-22 NOTE — Progress Notes (Unsigned)
    Return Prenatal Note   Subjective   23 y.o. G2P0101 at [redacted]w[redacted]d presents for this follow-up prenatal visit.  Patient feeling well, nausea improved. Anatomy u/s today, prelim report reviewed & complete. Patient reports: Movement: Present Contractions: Not present  Objective   Flow sheet Vitals: Pulse Rate: 95 BP: 125/71 Fundal Height: 20 cm Fetal Heart Rate (bpm): 145 Total weight gain: 1 lb 4.8 oz (0.59 kg)  General Appearance  No acute distress, well appearing, and well nourished Pulmonary   Normal work of breathing Neurologic   Alert and oriented to person, place, and time Psychiatric   Mood and affect within normal limits   Assessment/Plan   Plan  23 y.o. G2P0101 at [redacted]w[redacted]d presents for follow-up OB visit. Reviewed prenatal record including previous visit note.  Supervision of other normal pregnancy, antepartum Red flag symptoms reviewed, discussed upcoming prenatal care.   History of pre-eclampsia Continue daily ASA      No orders of the defined types were placed in this encounter.  Return in 4 weeks (on 01/20/2024) for ROB.   Future Appointments  Date Time Provider Department Center  01/20/2024  2:35 PM Jayne Harlene CROME, CNM [facility] None     For next visit:  continue with routine prenatal care     Harlene CROME Jayne, CNM  12/23/2510:50 PM

## 2023-12-23 ENCOUNTER — Ambulatory Visit: Admitting: Certified Nurse Midwife

## 2023-12-23 ENCOUNTER — Ambulatory Visit (INDEPENDENT_AMBULATORY_CARE_PROVIDER_SITE_OTHER)

## 2023-12-23 VITALS — BP 125/71 | HR 95 | Wt 161.3 lb

## 2023-12-23 DIAGNOSIS — Z113 Encounter for screening for infections with a predominantly sexual mode of transmission: Secondary | ICD-10-CM

## 2023-12-23 DIAGNOSIS — Z3480 Encounter for supervision of other normal pregnancy, unspecified trimester: Secondary | ICD-10-CM

## 2023-12-23 DIAGNOSIS — Z3482 Encounter for supervision of other normal pregnancy, second trimester: Secondary | ICD-10-CM | POA: Diagnosis not present

## 2023-12-23 DIAGNOSIS — Z8759 Personal history of other complications of pregnancy, childbirth and the puerperium: Secondary | ICD-10-CM

## 2023-12-23 DIAGNOSIS — O09292 Supervision of pregnancy with other poor reproductive or obstetric history, second trimester: Secondary | ICD-10-CM | POA: Diagnosis not present

## 2023-12-23 DIAGNOSIS — Z3A20 20 weeks gestation of pregnancy: Secondary | ICD-10-CM | POA: Diagnosis not present

## 2023-12-23 DIAGNOSIS — Z3689 Encounter for other specified antenatal screening: Secondary | ICD-10-CM

## 2023-12-23 DIAGNOSIS — Z3A14 14 weeks gestation of pregnancy: Secondary | ICD-10-CM

## 2023-12-23 NOTE — Assessment & Plan Note (Signed)
 Red flag symptoms reviewed, discussed upcoming prenatal care.

## 2023-12-23 NOTE — Assessment & Plan Note (Signed)
Continue daily ASA

## 2023-12-23 NOTE — Patient Instructions (Signed)
 Second Trimester of Pregnancy  The second trimester of pregnancy is from week 14 through week 27. This is months 4 through 6 of pregnancy. During the second trimester: Morning sickness is less or has stopped. You may have more energy. You may feel hungry more often. At this time, your unborn baby is growing very fast. At the end of the sixth month, the unborn baby may be up to 12 inches long and weigh about 1 pounds. You will likely start to feel the baby move between 16 and 20 weeks of pregnancy. Body changes during your second trimester Your body continues to change during this time. The changes usually go away after your baby is born. Physical changes You will gain more weight. Your belly will get bigger. You may begin to get stretch marks on your hips, belly, and breasts. Your breasts will keep growing and may hurt. You may get dark spots or blotches on your face. A dark line from your belly button to the pubic area may appear. This line is called linea nigra. Your hair may grow faster and get thicker. Health changes You may have headaches. You may have heartburn. You may pee more often. You may have swollen, bulging veins (varicose veins). You may have trouble pooping (constipation), or swollen veins in the butt that can itch or get painful (hemorrhoids). You may have back pain. This is caused by: Weight gain. Pregnancy hormones that are relaxing the joints in your pelvis. Follow these instructions at home: Medicines Talk to your health care provider if you're taking medicines. Ask if the medicines are safe to take during pregnancy. Your provider may change the medicines that you take. Do not take any medicines unless told to by your provider. Take a prenatal vitamin that has at least 600 micrograms (mcg) of folic acid. Do not use herbal medicines, illegal drugs, or medicines that are not approved by your provider. Eating and drinking While you're pregnant your body needs  extra food for your growing baby. Talk with your provider about what to eat while pregnant. Activity Most women are able to exercise during pregnancy. Exercises may need to change as your pregnancy goes on. Talk to your provider about your activities and exercise routines. Relieving pain and discomfort Wear a good, supportive bra if your breasts hurt. Rest with your legs raised if you have leg cramps or low back pain. Take warm sitz baths to soothe pain from hemorrhoids. Use hemorrhoid cream if your provider says it's okay. Do not douche. Do not use tampons or scented pads. Do not use hot tubs, steam rooms, or saunas. Safety Wear your seatbelt at all times when you're in a car. Talk to your provider if someone hits you, hurts you, or yells at you. Talk with your provider if you're feeling sad or have thoughts of hurting yourself. Lifestyle Certain things can be harmful while you're pregnant. It's best to avoid the following: Do not drink alcohol,smoke, vape, or use products with nicotine  or tobacco in them. If you need help quitting, talk with your provider. Avoid cat litter boxes and soil used by cats. These things carry germs that can cause harm to your pregnancy and your baby. General instructions Keep all follow-up visits. It helps you and your unborn baby stay as healthy as possible. Write down your questions. Take them to your prenatal visits. Your provider will: Talk with you about your overall health. Give you advice or refer you to specialists who can help with different needs,  including: Prenatal education classes. Mental health and counseling. Foods and healthy eating. Ask for help if you need help with food. Where to find more information American Pregnancy Association: americanpregnancy.org [employer]: acog.org Office on [employer] Health: TravelLesson.ca Contact a health care provider if: You have a headache that does not go away  when you take medicine. You have any of these problems: You can't eat or drink. You throw up or feel like you may throw up. You have watery poop (diarrhea) for 2 days or more. You have pain when you pee or your pee smells bad. You have been sick for 2 days or more and are not getting better. Contact your provider right away if: You have any of these coming from your vagina: Abnormal discharge. Bad-smelling fluid. Bleeding. Your baby is moving less than usual. You have contractions, belly cramping, or have pain in your pelvis or lower back. You have symptoms of high blood pressure or preeclampsia. These include: A severe, throbbing headache that does not go away. Sudden or extreme swelling of your face, hands, legs, or feet. Vision problems: You see spots. You have blurry vision. Your eyes are sensitive to light. If you can't reach the provider, go to an urgent care or emergency room. Get help right away if: You faint, become confused, or can't think clearly. You have chest pain or trouble breathing. You have any kind of injury, such as from a fall or a car crash. These symptoms may be an emergency. Call 911 right away. Do not wait to see if the symptoms will go away. Do not drive yourself to the hospital. This information is not intended to replace advice given to you by your health care provider. Make sure you discuss any questions you have with your health care provider. Document Revised: 12/05/2022 Document Reviewed: 07/05/2022 Elsevier Patient Education  2024 Elsevier Inc. Problems to Watch for During Pregnancy During pregnancy, your body goes through many changes. Some changes may be uncomfortable. But most changes are not a serious problem. It's important to learn when certain signs and symptoms may be a problem. Talk with your health care provider about any medical conditions you have. Make sure you know the symptoms to watch for. Reporting problems early will prevent  complications. Problems to watch for during pregnancy You're more likely to get an infection during pregnancy. Let your provider know if you have signs of infection, such as: A fever. A bad-smelling fluid from your vagina. Peeing too often, wanting to pee urgently, or pain when you pee. Also, let your provider know if: You're very tired, you feel dizzy, or you faint. You have watery poop (diarrhea) for 24 hours or longer. You throw up or feel like throwing up for 24 hours or longer. You have cramping in your belly or have pain in your hips or lower back. You have spotting, bleeding, or leaking of fluid from your vagina. You have pain, swelling, or redness in an arm or leg. You should also watch for signs of high blood pressure and preeclampsia. These signs can be very serious. They include: A headache that doesn't go away when you take medicine. Sudden or very bad swelling of your face, hands, legs, or feet. Problems seeing, such as: You see spots. You have blurry vision. You may be sensitive to light. Why it's important to watch for these problems Watching and reporting problems to your provider can help prevent complications that may affect you and your baby. These  include: Higher risk of giving birth early. Infection that may be passed on to your baby. Higher risk for stillbirth. Follow these instructions at home:  Take your medicines only as told. Keep all follow-up visits. Your provider needs to monitor your health and your baby's health. Where to find more information To learn more, go to these websites: Centers for Disease Control and Prevention (CDC) at TonerPromos.no. Then: Click Health Topics A-Z. Type urgent maternal warning signs in the search box. [employer] (ACOG): acog.org Contact a health care provider if: You have any problems while you're pregnant. You feel your baby moving less than usual. You have any of these things: You  have strong emotions, such as sadness or anxiety, that affect your daily life. You do not feel safe in your home. You use tobacco, alcohol, or drugs, and you need help to stop. Get help right away if: You faint, have a seizure, or cannot think clearly. You have chest pain or difficulty breathing. You have any of the following symptoms and you were unable to reach your provider: You have symptoms of infection, including a fever, or have vaginal bleeding. You have symptoms of high blood pressure or preeclampsia. You have signs or symptoms of labor before 37 weeks of pregnancy. These include: Contractions that are 5 minutes or less apart, or that increase in frequency, intensity, or length. Sudden, sharp pain in the belly, or low back pain. Any amount of fluid that flows from your vagina without stopping. These symptoms may be an emergency. Call 911 right away. Do not wait to see if the symptoms will go away. Do not drive yourself to the hospital. This information is not intended to replace advice given to you by your health care provider. Make sure you discuss any questions you have with your health care provider. Document Revised: 08/13/2022 Document Reviewed: 08/13/2022 Elsevier Patient Education  2024 Elsevier Inc. Early Labor (Pre-Term Labor): What to Know Pregnancy normally lasts 39-41 weeks. Pre-term labor is when labor starts before you have been pregnant for 37 weeks. Babies who are born too early may be at an increased risk for long-term problems like cerebral palsy, developmental delays, and vision and hearing problems. Premature babies may also have problems soon after birth, such as problems with their blood sugar, body temperature, heart, and breathing. These babies often have trouble with feeding. These problems may be very serious in babies who are born before 34 weeks of pregnancy. What are the causes? The cause of pre-term labor is not known. What increases the risk? You're  more likely to have pre-term labor if: You have medical problems, now or in the past. You have problems now or in your past pregnancies. You have risk factors related to lifestyle, environment, and age. Medical history You have problems affecting your uterus, including a short cervix. You have a sexually transmitted infection (STI) or other infections of the urinary tract and the vagina. You have: Blood clotting problems. High blood pressure. High blood sugar. You have a low body weight or too much body weight. Present and past pregnancies You have had pre-term labor before. You're pregnant with more than one baby. The placenta covers your cervix,. This is called placenta previa. Your unborn baby has a congenital condition. This means your baby will be born with the condition. You have bleeding from your vagina while you're pregnant. You became pregnant through in vitro fertilization (IVF). Lifestyle and environmental factors You use drugs, tobacco products or  drink alcohol. You have stress and no social support. You experience domestic violence. You're exposed to certain chemicals at home or work. Other factors You're younger than age 8 or older than age 50. What are the signs or symptoms?  Cramps like those that can happen during a menstrual period. The cramps may happen with diarrhea, which is watery poop. Pain your belly or lower back. Regular contractions. It may feel like your belly is getting tighter. Pressure in your pelvis. Increased watery or bloody discharge from your vagina. Your water breaking. How is this diagnosed? This condition is diagnosed based on: Your medical history and a physical exam. A pelvic exam. An ultrasound. Monitoring for contractions. Other tests, including: A swab of the cervix to check for a protein substance called fetal fibronectin. This protein is usually present in the area between the uterus and the amniotic sac early in pregnancy and  then again towards the end. If it's found in the middle of pregnancy, it can sometimes be a sign of pre-term labor. Urine tests. How is this treated? Treatment depends on how far along your pregnancy is, the health of your baby, and your health. Treatment may include: Taking medicines to: Stop contractions. Help the baby's lungs mature if the risk of early delivery is high. Medicines to help prevent your baby from having cerebral palsy or other problems. Bed rest. If the labor happens before 34 weeks of pregnancy, you may need to stay in the hospital. Delivery of the baby. Follow these instructions at home: Do not smoke, vape, or use nicotine  or tobacco. Do not drink alcohol. Take your medicines only as told. Rest as told by your provider. Ask what things are safe for you to do at home. Ask when you can go back to work or school. Keep all follow-up visits. Your provider will need to closely follow your health and the health of your baby. How is this prevented? To increase your chance of having a full-term pregnancy: Do not use drugs or take medicines that have not been prescribed to you during your pregnancy. Talk with your provider before taking any herbal supplements, even if you have been taking them regularly. Gain a healthy amount of weight during your pregnancy. Watch for infection. If you think that you might have an infection, get it checked right away. Symptoms of infection may include: Fever. Discharge from your vagina that smells bad or is not normal. Pain or burning when you pee. Having to pee small amounts or very often. Blood in your pee. Where to find more information To learn more, go to these websites: U.S. Department of Health and [employer] on Women's Health: http://hoffman.com/ [employer]: www.acog.org Centers for Disease Control and Prevention at DiningCalendar.de. Then: Click Search and type preterm labor. Find  the link you need. Contact a health care provider if: You think you're going into pre-term labor. You have signs or symptoms of pre-term labor. You have symptoms of infection. Get help right away if: You're having regular, painful contractions every 5 minutes or less. Your water breaks. This information is not intended to replace advice given to you by your health care provider. Make sure you discuss any questions you have with your health care provider. Document Revised: 09/12/2022 Document Reviewed: 09/12/2022 Elsevier Patient Education  2024 [employer].

## 2023-12-24 ENCOUNTER — Other Ambulatory Visit

## 2023-12-28 ENCOUNTER — Other Ambulatory Visit: Payer: Self-pay | Admitting: Certified Nurse Midwife

## 2023-12-28 DIAGNOSIS — Z3480 Encounter for supervision of other normal pregnancy, unspecified trimester: Secondary | ICD-10-CM

## 2023-12-28 DIAGNOSIS — Z369 Encounter for antenatal screening, unspecified: Secondary | ICD-10-CM

## 2024-01-19 NOTE — Progress Notes (Unsigned)
    Return Prenatal Note   Subjective   23 y.o. G2P0101 at [redacted]w[redacted]d presents for this follow-up prenatal visit.  Patient feeling well, active baby. Patient reports: Movement: Present Contractions: Not present  Objective   Flow sheet Vitals: Pulse Rate: 97 BP: 126/82 Fundal Height: 25 cm Fetal Heart Rate (bpm): 145 Total weight gain: 4 lb 8 oz (2.041 kg)  General Appearance  No acute distress, well appearing, and well nourished Pulmonary   Normal work of breathing Neurologic   Alert and oriented to person, place, and time Psychiatric   Mood and affect within normal limits   Assessment/Plan   Plan  23 y.o. G2P0101 at [redacted]w[redacted]d presents for follow-up OB visit. Reviewed prenatal record including previous visit note.  Supervision of other normal pregnancy, antepartum Red flag symptoms reviewed. Anticipatory guidance for 28w labs, TDAP discussed.  Rh negative, antepartum Antibody screen & rhogam next visit.  History of pre-eclampsia CMP & UP/C to be collected with 28w labs, orders placed. Continue daily 81mg  ASA.      Orders Placed This Encounter  Procedures   28 Weeks RH-Panel    Standing Status:   Future    Expected Date:   02/19/2024    Expiration Date:   01/19/2025   Comprehensive metabolic panel with GFR    Standing Status:   Future    Expected Date:   02/19/2024    Expiration Date:   01/19/2025   Protein / creatinine ratio, urine    Standing Status:   Future    Expected Date:   02/19/2024    Expiration Date:   01/19/2025   No follow-ups on file.   Future Appointments  Date Time Provider Department Center  02/17/2024  8:40 AM [facility] None  02/17/2024  8:55 AM Sebastian Sham, CNM [facility] None     For next visit:  ROB with 1 hour glucola, third trimester labs, and Tdap     Harlene LITTIE Cisco, CNM  11/04/253:57 PM

## 2024-01-19 NOTE — Patient Instructions (Incomplete)
 Second Trimester of Pregnancy  The second trimester of pregnancy is from week 14 through week 27. This is months 4 through 6 of pregnancy. During the second trimester: Morning sickness is less or has stopped. You may have more energy. You may feel hungry more often. At this time, your unborn baby is growing very fast. At the end of the sixth month, the unborn baby may be up to 12 inches long and weigh about 1 pounds. You will likely start to feel the baby move between 16 and 20 weeks of pregnancy. Body changes during your second trimester Your body continues to change during this time. The changes usually go away after your baby is born. Physical changes You will gain more weight. Your belly will get bigger. You may begin to get stretch marks on your hips, belly, and breasts. Your breasts will keep growing and may hurt. You may get dark spots or blotches on your face. A dark line from your belly button to the pubic area may appear. This line is called linea nigra. Your hair may grow faster and get thicker. Health changes You may have headaches. You may have heartburn. You may pee more often. You may have swollen, bulging veins (varicose veins). You may have trouble pooping (constipation), or swollen veins in the butt that can itch or get painful (hemorrhoids). You may have back pain. This is caused by: Weight gain. Pregnancy hormones that are relaxing the joints in your pelvis. Follow these instructions at home: Medicines Talk to your health care provider if you're taking medicines. Ask if the medicines are safe to take during pregnancy. Your provider may change the medicines that you take. Do not take any medicines unless told to by your provider. Take a prenatal vitamin that has at least 600 micrograms (mcg) of folic acid. Do not use herbal medicines, illegal drugs, or medicines that are not approved by your provider. Eating and drinking While you're pregnant your body needs  extra food for your growing baby. Talk with your provider about what to eat while pregnant. Activity Most women are able to exercise during pregnancy. Exercises may need to change as your pregnancy goes on. Talk to your provider about your activities and exercise routines. Relieving pain and discomfort Wear a good, supportive bra if your breasts hurt. Rest with your legs raised if you have leg cramps or low back pain. Take warm sitz baths to soothe pain from hemorrhoids. Use hemorrhoid cream if your provider says it's okay. Do not douche. Do not use tampons or scented pads. Do not use hot tubs, steam rooms, or saunas. Safety Wear your seatbelt at all times when you're in a car. Talk to your provider if someone hits you, hurts you, or yells at you. Talk with your provider if you're feeling sad or have thoughts of hurting yourself. Lifestyle Certain things can be harmful while you're pregnant. It's best to avoid the following: Do not drink alcohol,smoke, vape, or use products with nicotine or tobacco in them. If you need help quitting, talk with your provider. Avoid cat litter boxes and soil used by cats. These things carry germs that can cause harm to your pregnancy and your baby. General instructions Keep all follow-up visits. It helps you and your unborn baby stay as healthy as possible. Write down your questions. Take them to your prenatal visits. Your provider will: Talk with you about your overall health. Give you advice or refer you to specialists who can help with different needs,  including: Prenatal education classes. Mental health and counseling. Foods and healthy eating. Ask for help if you need help with food. Where to find more information American Pregnancy Association: americanpregnancy.org [employer]: acog.org Office on [employer] Health: TravelLesson.ca Contact a health care provider if: You have a headache that does not go away  when you take medicine. You have any of these problems: You can't eat or drink. You throw up or feel like you may throw up. You have watery poop (diarrhea) for 2 days or more. You have pain when you pee or your pee smells bad. You have been sick for 2 days or more and are not getting better. Contact your provider right away if: You have any of these coming from your vagina: Abnormal discharge. Bad-smelling fluid. Bleeding. Your baby is moving less than usual. You have contractions, belly cramping, or have pain in your pelvis or lower back. You have symptoms of high blood pressure or preeclampsia. These include: A severe, throbbing headache that does not go away. Sudden or extreme swelling of your face, hands, legs, or feet. Vision problems: You see spots. You have blurry vision. Your eyes are sensitive to light. If you can't reach the provider, go to an urgent care or emergency room. Get help right away if: You faint, become confused, or can't think clearly. You have chest pain or trouble breathing. You have any kind of injury, such as from a fall or a car crash. These symptoms may be an emergency. Call 911 right away. Do not wait to see if the symptoms will go away. Do not drive yourself to the hospital. This information is not intended to replace advice given to you by your health care provider. Make sure you discuss any questions you have with your health care provider. Document Revised: 12/05/2022 Document Reviewed: 07/05/2022 Elsevier Patient Education  2024 [employer].

## 2024-01-20 ENCOUNTER — Ambulatory Visit: Admitting: Certified Nurse Midwife

## 2024-01-20 VITALS — BP 126/82 | HR 97 | Wt 164.5 lb

## 2024-01-20 DIAGNOSIS — Z3480 Encounter for supervision of other normal pregnancy, unspecified trimester: Secondary | ICD-10-CM

## 2024-01-20 DIAGNOSIS — O09292 Supervision of pregnancy with other poor reproductive or obstetric history, second trimester: Secondary | ICD-10-CM | POA: Diagnosis not present

## 2024-01-20 DIAGNOSIS — Z3A24 24 weeks gestation of pregnancy: Secondary | ICD-10-CM

## 2024-01-20 DIAGNOSIS — Z114 Encounter for screening for human immunodeficiency virus [HIV]: Secondary | ICD-10-CM

## 2024-01-20 DIAGNOSIS — Z6791 Unspecified blood type, Rh negative: Secondary | ICD-10-CM | POA: Insufficient documentation

## 2024-01-20 DIAGNOSIS — Z8759 Personal history of other complications of pregnancy, childbirth and the puerperium: Secondary | ICD-10-CM

## 2024-01-20 DIAGNOSIS — O360120 Maternal care for anti-D [Rh] antibodies, second trimester, not applicable or unspecified: Secondary | ICD-10-CM | POA: Diagnosis not present

## 2024-01-20 DIAGNOSIS — Z131 Encounter for screening for diabetes mellitus: Secondary | ICD-10-CM

## 2024-01-20 DIAGNOSIS — Z3482 Encounter for supervision of other normal pregnancy, second trimester: Secondary | ICD-10-CM

## 2024-01-20 DIAGNOSIS — Z113 Encounter for screening for infections with a predominantly sexual mode of transmission: Secondary | ICD-10-CM

## 2024-01-20 NOTE — Assessment & Plan Note (Signed)
 Antibody screen & rhogam next visit.

## 2024-01-20 NOTE — Assessment & Plan Note (Signed)
 Red flag symptoms reviewed. Anticipatory guidance for 28w labs, TDAP discussed.

## 2024-01-20 NOTE — Assessment & Plan Note (Addendum)
 CMP & UP/C to be collected with 28w labs, orders placed. Continue daily 81mg  ASA.

## 2024-02-09 ENCOUNTER — Encounter: Payer: Self-pay | Admitting: Certified Nurse Midwife

## 2024-02-17 ENCOUNTER — Encounter: Admitting: Certified Nurse Midwife

## 2024-02-17 ENCOUNTER — Other Ambulatory Visit

## 2024-02-17 DIAGNOSIS — Z131 Encounter for screening for diabetes mellitus: Secondary | ICD-10-CM

## 2024-02-17 DIAGNOSIS — Z8759 Personal history of other complications of pregnancy, childbirth and the puerperium: Secondary | ICD-10-CM

## 2024-02-17 DIAGNOSIS — Z114 Encounter for screening for human immunodeficiency virus [HIV]: Secondary | ICD-10-CM

## 2024-02-17 DIAGNOSIS — Z113 Encounter for screening for infections with a predominantly sexual mode of transmission: Secondary | ICD-10-CM

## 2024-02-17 DIAGNOSIS — O26899 Other specified pregnancy related conditions, unspecified trimester: Secondary | ICD-10-CM

## 2024-02-17 NOTE — Progress Notes (Deleted)
    Return Prenatal Note   Subjective   23 y.o. G2P0101 at [redacted]w[redacted]d presents for this follow-up prenatal visit.  Patient *** Patient reports:    Objective   Flow sheet Vitals:   Total weight gain: 4 lb 8 oz (2.041 kg)  General Appearance  No acute distress, well appearing, and well nourished Pulmonary   Normal work of breathing Neurologic   Alert and oriented to person, place, and time Psychiatric   Mood and affect within normal limits   Assessment/Plan   Plan  23 y.o. G2P0101 at [redacted]w[redacted]d presents for follow-up OB visit. Reviewed prenatal record including previous visit note.  No problem-specific Assessment & Plan notes found for this encounter.      No orders of the defined types were placed in this encounter.  No follow-ups on file.   No future appointments.  For next visit:  {[id number]}     Rollo JINNY Maxin, CMA  12/02/259:04 AM

## 2024-02-18 ENCOUNTER — Ambulatory Visit: Admitting: Licensed Practical Nurse

## 2024-02-18 ENCOUNTER — Encounter: Payer: Self-pay | Admitting: Licensed Practical Nurse

## 2024-02-18 VITALS — BP 121/83 | HR 111 | Wt 169.7 lb

## 2024-02-18 DIAGNOSIS — O360130 Maternal care for anti-D [Rh] antibodies, third trimester, not applicable or unspecified: Secondary | ICD-10-CM | POA: Diagnosis not present

## 2024-02-18 DIAGNOSIS — Z23 Encounter for immunization: Secondary | ICD-10-CM

## 2024-02-18 DIAGNOSIS — Z3A28 28 weeks gestation of pregnancy: Secondary | ICD-10-CM | POA: Diagnosis not present

## 2024-02-18 DIAGNOSIS — Z3480 Encounter for supervision of other normal pregnancy, unspecified trimester: Secondary | ICD-10-CM

## 2024-02-18 DIAGNOSIS — Z6791 Unspecified blood type, Rh negative: Secondary | ICD-10-CM

## 2024-02-18 LAB — COMPREHENSIVE METABOLIC PANEL WITH GFR
ALT: 35 IU/L — ABNORMAL HIGH (ref 0–32)
AST: 23 IU/L (ref 0–40)
Albumin: 3.4 g/dL — ABNORMAL LOW (ref 4.0–5.0)
Alkaline Phosphatase: 134 IU/L — ABNORMAL HIGH (ref 41–116)
BUN/Creatinine Ratio: 16 (ref 9–23)
BUN: 8 mg/dL (ref 6–20)
Bilirubin Total: <0.2 mg/dL (ref 0.0–1.2)
CO2: 20 mmol/L (ref 20–29)
Calcium: 8.7 mg/dL (ref 8.7–10.2)
Chloride: 102 mmol/L (ref 96–106)
Creatinine, Ser: 0.49 mg/dL — ABNORMAL LOW (ref 0.57–1.00)
Globulin, Total: 2.8 g/dL (ref 1.5–4.5)
Glucose: 71 mg/dL (ref 70–99)
Potassium: 3.6 mmol/L (ref 3.5–5.2)
Sodium: 135 mmol/L (ref 134–144)
Total Protein: 6.2 g/dL (ref 6.0–8.5)
eGFR: 137 mL/min/1.73 (ref >59)

## 2024-02-18 LAB — 28 WEEKS RH-PANEL
Antibody Screen: NEGATIVE
Basophils Absolute: 0 x10E3/uL (ref 0.0–0.2)
Basos: 0 %
EOS (ABSOLUTE): 0.1 x10E3/uL (ref 0.0–0.4)
Eos: 1 %
Gestational Diabetes Screen: 76 mg/dL (ref 70–139)
HIV Screen 4th Generation wRfx: NONREACTIVE
Hematocrit: 33.2 % — ABNORMAL LOW (ref 34.0–46.6)
Hemoglobin: 10.8 g/dL — ABNORMAL LOW (ref 11.1–15.9)
Immature Grans (Abs): 0 x10E3/uL (ref 0.0–0.1)
Immature Granulocytes: 0 %
Lymphocytes Absolute: 1.9 x10E3/uL (ref 0.7–3.1)
Lymphs: 26 %
MCH: 29 pg (ref 26.6–33.0)
MCHC: 32.5 g/dL (ref 31.5–35.7)
MCV: 89 fL (ref 79–97)
Monocytes Absolute: 0.6 x10E3/uL (ref 0.1–0.9)
Monocytes: 8 %
Neutrophils Absolute: 4.8 x10E3/uL (ref 1.4–7.0)
Neutrophils: 65 %
Platelets: 221 x10E3/uL (ref 150–450)
RBC: 3.73 x10E6/uL — ABNORMAL LOW (ref 3.77–5.28)
RDW: 11.9 % (ref 11.7–15.4)
RPR Ser Ql: NONREACTIVE
WBC: 7.3 x10E3/uL (ref 3.4–10.8)

## 2024-02-18 LAB — PROTEIN / CREATININE RATIO, URINE
Creatinine, Urine: 107.8 mg/dL
Protein, Ur: 59.8 mg/dL
Protein/Creat Ratio: 555 mg/g{creat} — ABNORMAL HIGH (ref 0–200)

## 2024-02-18 MED ORDER — RHO D IMMUNE GLOBULIN 1500 UNIT/2ML IJ SOSY
300.0000 ug | PREFILLED_SYRINGE | Freq: Once | INTRAMUSCULAR | Status: AC
  Administered 2024-02-18: 300 ug via INTRAMUSCULAR

## 2024-02-18 NOTE — Progress Notes (Signed)
    Return Prenatal Note   Subjective   23 y.o. G2P0101 at [redacted]w[redacted]d presents for this follow-up prenatal visit.  Patient Here with her partner's grandmother, and son Patient reports:has been light headed, head feels  like it is spinning-this has kicked up in the last week, snacks a lot -crakers chips, anything quick and easy, meals are ealry morning and late evenining , fludis a good amount 3 stanley cups plus soda, happens when on phone scrolling or go to lay down, last eye exam Sept this year  -Lower abdominal pressure, is random, more in the evening, is a [facility]  -Lower right back pain that shoots  down her leg, happens a couple times a week,  -Pumped x 8 months, first child was tongue and lip tied, would like to try to breastfeed  -Would like to try to labor and birth without an epidural, the experience of getting the  epidural was more painful than contractions.    Movement: Present Contractions: Not present  Objective   Flow sheet Vitals: Pulse Rate: (!) 111 BP: 121/83 Fundal Height: 28 cm Fetal Heart Rate (bpm): 150 Total weight gain: 9 lb 11.2 oz (4.4 kg)  General Appearance  No acute distress, well appearing, and well nourished Pulmonary   Normal work of breathing Neurologic   Alert and oriented to person, place, and time Psychiatric   Mood and affect within normal limits   Assessment/Plan   Plan  23 y.o. G2P0101 at [redacted]w[redacted]d presents for follow-up OB visit. Reviewed prenatal record including previous visit note.  Rh negative, antepartum -Rhogam given today   Supervision of other normal pregnancy, antepartum -TWG 9lbs, WNL -Rec support belt, may try home stretches for sciatica-consider PT or Chiro if home stretches not helping,  -Rec increasing protein intake and comfort measures for nasal  congestion to help lightheadedness,  -hospital routine regarding breastfeeding, skin to skin reviewed  -warning sings reviewed       Orders Placed This Encounter   Procedures   Tdap vaccine greater than or equal to 7yo IM   Return in about 2 weeks (around 03/03/2024) for ROB.   Future Appointments  Date Time Provider Department Center  03/05/2024 10:55 AM Justino Eleanor HERO, CNM [facility] None    For next visit:  continue with routine prenatal care     JINNIE HERO [facility], CNM  12/03/252:37 PM

## 2024-02-18 NOTE — Assessment & Plan Note (Signed)
 Rhogam given today

## 2024-02-18 NOTE — Assessment & Plan Note (Addendum)
-  TWG 9lbs, WNL -Rec support belt, may try home stretches for sciatica-consider PT or Chiro if home stretches not helping,  -Rec increasing protein intake and comfort measures for nasal  congestion to help lightheadedness,  -hospital routine regarding breastfeeding, skin to skin reviewed  -warning sings reviewed

## 2024-02-20 ENCOUNTER — Ambulatory Visit: Payer: Self-pay | Admitting: Certified Nurse Midwife

## 2024-03-01 ENCOUNTER — Observation Stay
Admission: EM | Admit: 2024-03-01 | Discharge: 2024-03-03 | Disposition: A | Attending: Family Medicine | Admitting: Certified Nurse Midwife

## 2024-03-01 ENCOUNTER — Encounter: Payer: Self-pay | Admitting: Obstetrics & Gynecology

## 2024-03-01 DIAGNOSIS — O26893 Other specified pregnancy related conditions, third trimester: Principal | ICD-10-CM | POA: Diagnosis present

## 2024-03-01 DIAGNOSIS — Z3A30 30 weeks gestation of pregnancy: Secondary | ICD-10-CM | POA: Diagnosis not present

## 2024-03-01 DIAGNOSIS — K8020 Calculus of gallbladder without cholecystitis without obstruction: Secondary | ICD-10-CM | POA: Diagnosis not present

## 2024-03-01 DIAGNOSIS — O2303 Infections of kidney in pregnancy, third trimester: Secondary | ICD-10-CM | POA: Diagnosis not present

## 2024-03-01 DIAGNOSIS — R1031 Right lower quadrant pain: Secondary | ICD-10-CM | POA: Diagnosis not present

## 2024-03-01 DIAGNOSIS — O26613 Liver and biliary tract disorders in pregnancy, third trimester: Secondary | ICD-10-CM | POA: Diagnosis not present

## 2024-03-01 DIAGNOSIS — Z7982 Long term (current) use of aspirin: Secondary | ICD-10-CM | POA: Diagnosis not present

## 2024-03-01 LAB — WET PREP, GENITAL
Clue Cells Wet Prep HPF POC: NONE SEEN
Sperm: NONE SEEN
Trich, Wet Prep: NONE SEEN
WBC, Wet Prep HPF POC: <10 (ref <10)
Yeast Wet Prep HPF POC: NONE SEEN

## 2024-03-01 LAB — CBC
HCT: 30.5 % — ABNORMAL LOW (ref 36.0–46.0)
Hemoglobin: 10.3 g/dL — ABNORMAL LOW (ref 12.0–15.0)
MCH: 29.2 pg (ref 26.0–34.0)
MCHC: 33.8 g/dL (ref 30.0–36.0)
MCV: 86.4 fL (ref 80.0–100.0)
Platelets: 212 K/uL (ref 150–400)
RBC: 3.53 MIL/uL — ABNORMAL LOW (ref 3.87–5.11)
RDW: 12.9 % (ref 11.5–15.5)
WBC: 9.4 K/uL (ref 4.0–10.5)
nRBC: 0 % (ref 0.0–0.2)

## 2024-03-01 LAB — PROTEIN / CREATININE RATIO, URINE
Creatinine, Urine: 104 mg/dL
Protein Creatinine Ratio: 0.23 mg/mg{creat} — ABNORMAL HIGH (ref 0.00–0.15)
Total Protein, Urine: 24 mg/dL

## 2024-03-01 LAB — URINALYSIS, ROUTINE W REFLEX MICROSCOPIC
Bilirubin Urine: NEGATIVE
Glucose, UA: NEGATIVE mg/dL
Hgb urine dipstick: NEGATIVE
Ketones, ur: NEGATIVE mg/dL
Nitrite: POSITIVE — AB
Protein, ur: NEGATIVE mg/dL
Specific Gravity, Urine: 1.018 (ref 1.005–1.030)
WBC, UA: >50 WBC/hpf (ref 0–5)
pH: 6 (ref 5.0–8.0)

## 2024-03-01 MED ORDER — ACETAMINOPHEN 500 MG PO TABS
1000.0000 mg | ORAL_TABLET | Freq: Four times a day (QID) | ORAL | Status: DC | PRN
  Administered 2024-03-03: 08:00:00 1000 mg via ORAL
  Filled 2024-03-01: qty 2

## 2024-03-01 MED ORDER — LACTATED RINGERS IV SOLN: 500.0000 mL | INTRAVENOUS | Status: DC | PRN

## 2024-03-01 MED ORDER — ONDANSETRON HCL 4 MG/2ML IJ SOLN: 4.0000 mg | Freq: Four times a day (QID) | INTRAMUSCULAR | Status: DC | PRN

## 2024-03-01 MED ORDER — SOD CITRATE-CITRIC ACID 500-334 MG/5ML PO SOLN: 30.0000 mL | ORAL | Status: DC | PRN

## 2024-03-01 NOTE — OB Triage Note (Signed)
 Faith [name] 23 y.o. @G2P1  @30w0dGA   presents to Labor & Delivery triage via wheelchair steered by ED staff reporting abdominal cramping and seeing spots with lightheadedness.She states that these things have been going on for the past week .She rates her pain and 8 out 10 that awakened her from her sleep tonight. She denies signs and symptoms consistent with rupture of membranes or active vaginal bleeding. She states positive fetal movement. External FM and TOCO applied to non-tender abdomen. Initial FHR 148. Vital signs obtained and within normal limits. Patient oriented to care environment including call bell and bed control use. Harlene Cisco, CNM notified of patient's arrival.

## 2024-03-01 NOTE — OB Triage Note (Incomplete)
 [name]

## 2024-03-02 ENCOUNTER — Observation Stay

## 2024-03-02 DIAGNOSIS — N134 Hydroureter: Secondary | ICD-10-CM | POA: Diagnosis not present

## 2024-03-02 DIAGNOSIS — N1330 Unspecified hydronephrosis: Secondary | ICD-10-CM | POA: Diagnosis not present

## 2024-03-02 DIAGNOSIS — Z3A30 30 weeks gestation of pregnancy: Secondary | ICD-10-CM | POA: Diagnosis not present

## 2024-03-02 DIAGNOSIS — Z7982 Long term (current) use of aspirin: Secondary | ICD-10-CM | POA: Diagnosis not present

## 2024-03-02 DIAGNOSIS — Z3689 Encounter for other specified antenatal screening: Secondary | ICD-10-CM | POA: Diagnosis not present

## 2024-03-02 DIAGNOSIS — O26893 Other specified pregnancy related conditions, third trimester: Secondary | ICD-10-CM | POA: Diagnosis not present

## 2024-03-02 DIAGNOSIS — K8020 Calculus of gallbladder without cholecystitis without obstruction: Secondary | ICD-10-CM | POA: Diagnosis not present

## 2024-03-02 DIAGNOSIS — R0789 Other chest pain: Secondary | ICD-10-CM | POA: Diagnosis not present

## 2024-03-02 DIAGNOSIS — K8000 Calculus of gallbladder with acute cholecystitis without obstruction: Secondary | ICD-10-CM | POA: Diagnosis not present

## 2024-03-02 DIAGNOSIS — O2303 Infections of kidney in pregnancy, third trimester: Secondary | ICD-10-CM | POA: Diagnosis not present

## 2024-03-02 DIAGNOSIS — R1031 Right lower quadrant pain: Secondary | ICD-10-CM | POA: Diagnosis not present

## 2024-03-02 DIAGNOSIS — O26613 Liver and biliary tract disorders in pregnancy, third trimester: Secondary | ICD-10-CM | POA: Diagnosis not present

## 2024-03-02 DIAGNOSIS — O2300 Infections of kidney in pregnancy, unspecified trimester: Secondary | ICD-10-CM | POA: Diagnosis present

## 2024-03-02 LAB — LIPASE, BLOOD: Lipase: 20 U/L (ref 11–51)

## 2024-03-02 LAB — TYPE AND SCREEN
ABO/RH(D): O NEG
Antibody Screen: POSITIVE

## 2024-03-02 LAB — COMPREHENSIVE METABOLIC PANEL WITH GFR
ALT: 61 U/L — ABNORMAL HIGH (ref 0–44)
ALT: 62 U/L — ABNORMAL HIGH (ref 0–44)
AST: 41 U/L (ref 15–41)
AST: 47 U/L — ABNORMAL HIGH (ref 15–41)
Albumin: 3.5 g/dL (ref 3.5–5.0)
Albumin: 3.6 g/dL (ref 3.5–5.0)
Alkaline Phosphatase: 159 U/L — ABNORMAL HIGH (ref 38–126)
Alkaline Phosphatase: 156 U/L — ABNORMAL HIGH (ref 38–126)
Anion gap: 10 (ref 5–15)
Anion gap: 10 (ref 5–15)
BUN: <5 mg/dL — ABNORMAL LOW (ref 6–20)
BUN: 5 mg/dL — ABNORMAL LOW (ref 6–20)
CO2: 23 mmol/L (ref 22–32)
CO2: 21 mmol/L — ABNORMAL LOW (ref 22–32)
Calcium: 8.6 mg/dL — ABNORMAL LOW (ref 8.9–10.3)
Calcium: 9 mg/dL (ref 8.9–10.3)
Chloride: 102 mmol/L (ref 98–111)
Chloride: 104 mmol/L (ref 98–111)
Creatinine, Ser: 0.38 mg/dL — ABNORMAL LOW (ref 0.44–1.00)
Creatinine, Ser: 0.36 mg/dL — ABNORMAL LOW (ref 0.44–1.00)
GFR, Estimated: >60 mL/min (ref >60)
GFR, Estimated: >60 mL/min (ref >60)
Glucose, Bld: 77 mg/dL (ref 70–99)
Glucose, Bld: 111 mg/dL — ABNORMAL HIGH (ref 70–99)
Potassium: 3.3 mmol/L — ABNORMAL LOW (ref 3.5–5.1)
Potassium: 4 mmol/L (ref 3.5–5.1)
Sodium: 135 mmol/L (ref 135–145)
Sodium: 135 mmol/L (ref 135–145)
Total Bilirubin: 0.2 mg/dL (ref 0.0–1.2)
Total Bilirubin: 0.3 mg/dL (ref 0.0–1.2)
Total Protein: 6.7 g/dL (ref 6.5–8.1)
Total Protein: 6.8 g/dL (ref 6.5–8.1)

## 2024-03-02 LAB — CBC
HCT: 31.8 % — ABNORMAL LOW (ref 36.0–46.0)
Hemoglobin: 10.5 g/dL — ABNORMAL LOW (ref 12.0–15.0)
MCH: 29.1 pg (ref 26.0–34.0)
MCHC: 33 g/dL (ref 30.0–36.0)
MCV: 88.1 fL (ref 80.0–100.0)
Platelets: 201 K/uL (ref 150–400)
RBC: 3.61 MIL/uL — ABNORMAL LOW (ref 3.87–5.11)
RDW: 12.8 % (ref 11.5–15.5)
WBC: 9.4 K/uL (ref 4.0–10.5)
nRBC: 0 % (ref 0.0–0.2)

## 2024-03-02 LAB — AMYLASE: Amylase: 36 U/L (ref 28–100)

## 2024-03-02 LAB — CHLAMYDIA/NGC RT PCR (ARMC ONLY)
Chlamydia Tr: NOT DETECTED
N gonorrhoeae: NOT DETECTED

## 2024-03-02 LAB — SYPHILIS: RPR W/REFLEX TO RPR TITER AND TREPONEMAL ANTIBODIES, TRADITIONAL SCREENING AND DIAGNOSIS ALGORITHM: RPR Ser Ql: NONREACTIVE

## 2024-03-02 LAB — ABO/RH: ABO/RH(D): O NEG

## 2024-03-02 MED ORDER — ONDANSETRON 4 MG PO TBDP: 4.0000 mg | ORAL_TABLET | Freq: Four times a day (QID) | ORAL | Status: DC | PRN

## 2024-03-02 MED ORDER — BETAMETHASONE SOD PHOS & ACET 6 (3-3) MG/ML IJ SUSP
12.0000 mg | INTRAMUSCULAR | Status: AC
  Administered 2024-03-02 – 2024-03-03 (×2): 12 mg via INTRAMUSCULAR
  Filled 2024-03-02: qty 5

## 2024-03-02 MED ORDER — SODIUM CHLORIDE 0.9 % IV SOLN
2.0000 g | INTRAVENOUS | Status: DC
  Administered 2024-03-02 – 2024-03-03 (×2): 2 g via INTRAVENOUS
  Filled 2024-03-02 (×2): qty 20

## 2024-03-02 MED ORDER — CALCIUM CARBONATE ANTACID 500 MG PO CHEW: 400.0000 mg | CHEWABLE_TABLET | Freq: Three times a day (TID) | ORAL | Status: DC | PRN

## 2024-03-02 MED ORDER — LACTATED RINGERS IV SOLN: INTRAVENOUS | Status: AC

## 2024-03-02 MED ORDER — FAMOTIDINE 20 MG PO TABS
20.0000 mg | ORAL_TABLET | Freq: Every day | ORAL | Status: DC
  Administered 2024-03-02 – 2024-03-03 (×2): 20 mg via ORAL
  Filled 2024-03-02 (×2): qty 1

## 2024-03-02 MED ORDER — PRENATAL MULTIVITAMIN CH
1.0000 | ORAL_TABLET | Freq: Every day | ORAL | Status: DC
  Filled 2024-03-02: qty 1

## 2024-03-02 MED ORDER — DOCUSATE SODIUM 100 MG PO CAPS
100.0000 mg | ORAL_CAPSULE | Freq: Every day | ORAL | Status: DC
  Administered 2024-03-02 – 2024-03-03 (×2): 100 mg via ORAL
  Filled 2024-03-02 (×2): qty 1

## 2024-03-02 MED ORDER — ONDANSETRON HCL 4 MG/2ML IJ SOLN
4.0000 mg | Freq: Four times a day (QID) | INTRAMUSCULAR | Status: DC | PRN
  Administered 2024-03-02 – 2024-03-03 (×2): 4 mg via INTRAVENOUS
  Filled 2024-03-02 (×2): qty 2

## 2024-03-02 NOTE — Progress Notes (Signed)
 Pt called out stating she got dizzy and vomited on the floor.   Pt recently arrived to room from MRI, pt reports towards the end of the MRI she felt warm/hot and when she got into the wheel chair to come back upstairs, she felt like she was going to pass out. Reports she still feels nauseous after vomiting. Pt reports she ate sausage biscuits for breakfast, for lunch she had a [employer] Frappe and 2 cheeseburgers and chips.   BP 109/61   Pulse (!) 107   Temp (P) 98.2 F (36.8 C) (Oral)   Resp 15   LMP 08/04/2023  Pt appears flushed with clammy skin  LUNGS: CTAB Heart RRR  RN to give Zofran   Cool washcloths to chest and back of neck   NO clear explanation for pt's symptoms, could have had a reaction to being in the small space of the MRI or not tolerating today's diet.   Jinnie Cookey, CNM  [facility] 03/02/2024 6:25 PM

## 2024-03-02 NOTE — Progress Notes (Signed)
 Daily Antepartum Note  Admission Date: 03/01/2024 Current Date: 03/02/2024 3:19 PM  Faith [name] is a 23 y.o. G2P0101 at 110w1d by LMP, HD#1, admitted for RUQ pain and glare spots in her vision .She was started on Ceftriaxone  for presumed pyelonephritis and has received 1 dose of betamethasone .  Pregnancy complicated by:  Patient Active Problem List   Diagnosis Date Noted   Pyelonephritis affecting pregnancy 03/02/2024   Abdominal pain affecting pregnancy 03/01/2024   Rh negative, antepartum 01/20/2024   Supervision of other normal pregnancy, antepartum 09/12/2023   History of pre-eclampsia 03/21/2022   Chronic migraine without aura without status migrainosus, not intractable 09/14/2020   Vertigo 03/22/2020   Hearing loss 01/22/2017   Acne vulgaris 12/13/2015   Allergy     Overnight/24hr events:  No changes overnight.  Imaging this morning shows Cholecystolithiasis. No changes of acute cholecystitis and Moderate Right hydronephrosis   Subjective:  Pt continues to have RUQ pain that is constant, it is more annoying than painful, It is stabbing. She has occasional nausea. She has been able to eat solid foods. She does continue to see white sots when when she is sitting or standing, the spots tend to improve with laying down. She also constantly feels dizzy.   Objective:    Current Vital Signs 24h Vital Sign Ranges  T 97.7 F (36.5 C) Temp  Avg: 97.9 F (36.6 C)  Min: 97.7 F (36.5 C)  Max: 98.2 F (36.8 C)  BP 109/60 BP  Min: 107/58  Max: 132/73  HR (!) 107 Pulse  Avg: 100.2  Min: 89  Max: 107  RR 15 Resp  Avg: 15.7  Min: 15  Max: 17  SaO2     No data recorded       24 Hour I/O Current Shift I/O  Time Ins Outs No intake/output data recorded. No intake/output data recorded.   Patient Vitals for the past 24 hrs:  BP Temp Temp src Pulse Resp  03/02/24 1245 109/60 97.7 F (36.5 C) Oral (!) 107 --  03/02/24 0921 132/73 97.8 F (36.6 C) Oral (!) 102 15  03/02/24  0255 (!) 107/58 97.9 F (36.6 C) Oral 100 15  03/02/24 0030 121/72 -- -- 89 --  03/01/24 2159 117/72 98.2 F (36.8 C) Oral (!) 103 17    Faith [name] 10/01/2000 [redacted]w[redacted]d  Fetus A Non-Stress Test Interpretation for 03/02/2024  Indication: triage visit   Fetal Heart Rate A Mode: External Baseline Rate (A): 135 bpm Variability: Moderate Accelerations: 15 x 15 Decelerations: None Multiple birth?: No  Uterine Activity Mode: Toco Contraction Frequency (min): none noted        Physical exam: General: Well nourished, well developed female in no acute distress. Abdomen: gravid soft, tenderness in RLQ and in RUQ + Murphy's sign RCVAT No LCVAT  Respiratory: breathing without difficulty  Skin: Warm and dry.  Extremities: no edema   Medications: Current Facility-Administered Medications  Medication Dose Route Frequency Provider Last Rate Last Admin   acetaminophen  (TYLENOL ) tablet 1,000 mg  1,000 mg Oral Q6H PRN Jayne Harlene CROME, CNM       betamethasone  acetate-betamethasone  sodium phosphate (CELESTONE ) injection 12 mg  12 mg Intramuscular Q24H Jayne Harlene CROME, CNM   12 mg at 03/02/24 0126   calcium  carbonate (TUMS - dosed in mg elemental calcium ) chewable tablet 400 mg of elemental calcium   400 mg of elemental calcium  Oral TID PRN Jayne Harlene CROME, CNM       cefTRIAXone  (ROCEPHIN ) 2 g in  sodium chloride  0.9 % 100 mL IVPB  2 g Intravenous Q24H Jayne Raisin L, CNM 200 mL/hr at 03/02/24 0141 2 g at 03/02/24 0141   docusate sodium  (COLACE) capsule 100 mg  100 mg Oral Daily Jayne Raisin CROME, CNM   100 mg at 03/02/24 1045   famotidine  (PEPCID ) tablet 20 mg  20 mg Oral Daily Jayne Raisin CROME, CNM   20 mg at 03/02/24 1045   lactated ringers  infusion 500-1,000 mL  500-1,000 mL Intravenous PRN Jayne Raisin CROME, CNM       lactated ringers  infusion   Intravenous Continuous Jayne Raisin CROME, CNM 125 mL/hr at 03/02/24 0252 New Bag at 03/02/24 0252   ondansetron   (ZOFRAN -ODT) disintegrating tablet 4 mg  4 mg Oral Q6H PRN Jayne Raisin CROME, CNM       Or   ondansetron  (ZOFRAN ) injection 4 mg  4 mg Intravenous Q6H PRN Jayne Raisin CROME, CNM       prenatal multivitamin tablet 1 tablet  1 tablet Oral Q1200 Jayne Raisin CROME, CNM       sodium citrate-citric acid  (ORACIT) solution 30 mL  30 mL Oral Q2H PRN Jayne Raisin CROME, CNM       Labs:  Recent Labs  Lab 03/01/24 2308 03/02/24 0621  WBC 9.4 9.4  HGB 10.3* 10.5*  HCT 30.5* 31.8*  PLT 212 201   Recent Labs  Lab 03/01/24 2308 03/02/24 0621  NA 135 135  K 3.3* 4.0  CL 102 104  CO2 23 21*  BUN <5* 5*  CREATININE 0.38* 0.36*  CALCIUM  8.6* 9.0  PROT 6.7 6.8  BILITOT 0.2 0.3  ALKPHOS 159* 156*  ALT 61* 62*  AST 41 47*  GLUCOSE 77 111*   Radiology:  US  of abdomen: Cholecystolithiasis. No changes of acute cholecystitis.  Renal US :          Moderate right-sided hydronephrosis.  Normal OB US    Assessment & Plan:  G2P0101 at 30wks with  RUQ and RLQ pain with Cholecystolithiasis and possible pyelonephritis    -General Surgery Dr Jordis consulted, MRI MRCP and abdomen  ordered Dr Greig Emory called, the pt cannot have contrast dye for MRI  -continue Ceftriaxone   -Betamethasone  #2 later today  -Bile acids ordered  -Dr Kizzie called, low suspicion for preeclampsia, will hold on MFM consult  -NST BID -24hour urine collection in process  -NICU consult dependent on disposition & plan for timing of delivery   Dr Suzanna updated  Jinnie Cookey, CNM  [facility] 03/01/2024 4:03 PM

## 2024-03-02 NOTE — H&P (Signed)
 HISTORY AND PHYSICAL NOTE  History of Present Illness: Faith [name] is a 23 y.o. G2P0101 at [redacted]w[redacted]d admitted for presumptive pyelonephritis.  She presented to L&D with complaints of right side pain, visual changes and dizziness with position changes. She reports about a week ago she began feeling dizzy with position changes. Today she has noticed glare spots in her vision, denies loss of visual field. Denies headache. Reports right side/abdominal pain started about a week ago. It is constant, uncomfortable and sometimes worse, resting helps some.  Pregnancy is complicated by history of pre-eclampsia with severe features in prior pregnancy with delivery at 34w; STI in pregnancy; pyelonephritis in pregnancy-1st trimester. She has not taken macrobid  as prescribed for suppression, last urine culture 11/12/23-no growth.   Denies nausea, vomiting, diarrhea, fever, chills or sick contacts.  Reports active fetal movement  Contractions: irregular cramping  LOF/SROM: denies Vaginal bleeding: denies  Prenatal care site:  [facility]  Patient Active Problem List   Diagnosis Date Noted   Pyelonephritis affecting pregnancy 03/02/2024   Abdominal pain affecting pregnancy 03/01/2024   Rh negative, antepartum 01/20/2024   Supervision of other normal pregnancy, antepartum 09/12/2023   History of pre-eclampsia 03/21/2022   Chronic migraine without aura without status migrainosus, not intractable 09/14/2020   Vertigo 03/22/2020   Hearing loss 01/22/2017   Acne vulgaris 12/13/2015   Allergy     Past Medical History:  Diagnosis Date   Allergy    GERD (gastroesophageal reflux disease)    Just began   Mononucleosis 23yo    Past Surgical History:  Procedure Laterality Date   WISDOM TOOTH EXTRACTION      OB History  Gravida Para Term Preterm AB Living  2 1 0 1  1  SAB IAB Ectopic Multiple Live Births      1    # Outcome Date GA Lbr Len/2nd Weight Sex Type Anes PTL Lv  2 Current           1 Preterm  11/25/21 [redacted]w[redacted]d  2240 g M Vag-Spont EPI Y LIV     Complications: Pre-eclampsia    Social History:  reports that she has never smoked. She has never used smokeless tobacco. She reports that she does not drink alcohol and does not use drugs.  Family History: family history includes Cancer in her mother, paternal grandfather, and paternal grandmother; Depression in her sister; Hearing loss in her father; Hypertension in her maternal grandmother and paternal grandmother.  Allergies[1]  Medications Prior to Admission  Medication Sig Dispense Refill Last Dose/Taking   aspirin  EC 81 MG tablet Take 1 tablet (81 mg total) by mouth daily. Take after 12 weeks for prevention of preeclampssia later in pregnancy 300 tablet 2 03/01/2024   nitrofurantoin , macrocrystal-monohydrate, (MACROBID ) 100 MG capsule Take 1 capsule (100 mg total) by mouth at bedtime. 90 capsule 1 Past Week   Prenatal Vit-Fe Fumarate-FA (PRENATAL PO) Take 2 each by mouth daily. Taking 2 gummies daily.   03/01/2024   DICLEGIS 10-10 MG TBEC Take 2 tablets by mouth at bedtime. (Patient not taking: Reported on 02/18/2024)      EPINEPHrine  0.3 mg/0.3 mL IJ SOAJ injection Inject 0.3 mg into the muscle as needed for anaphylaxis. 1 each 10    famotidine  (PEPCID ) 20 MG tablet TAKE 1 TABLET BY MOUTH TWICE A DAY (Patient not taking: Reported on 02/18/2024) 180 tablet 1    ondansetron  (ZOFRAN -ODT) 4 MG disintegrating tablet Take 1 tablet (4 mg total) by mouth every 8 (eight) hours as needed. (  Patient not taking: Reported on 01/20/2024) 30 tablet 1    promethazine  (PHENERGAN ) 25 MG suppository Place 1 suppository (25 mg total) rectally every 6 (six) hours as needed for nausea or vomiting. (Patient not taking: Reported on 01/20/2024) 12 each 4     ROS  Physical Examination: Vitals:  BP 121/72 (BP Location: Left Arm)   Pulse 89   Temp 98.2 F (36.8 C) (Oral)   Resp 17   LMP 08/04/2023  General: no acute distress.  HEENT: normocephalic,  atraumatic Heart: regular rate Lungs: normal work of breathing Abdomen: soft, gravid, right CVA tenderness present; ribs tender to palpation   Extremities: non-tender, symmetric, NO edema bilaterally.  DTRs: 2+  Neurologic: Alert & oriented x 3.    Membranes: intact FHT:  FHR: 140 bpm, variability: moderate,  accelerations:  Present,  decelerations:  Absent Category/reactivity:  Category I UC:   none  Labs:  Results for orders placed or performed during the hospital encounter of 03/01/24 (from the past 24 hours)  Wet prep, genital   Collection Time: 03/01/24 10:40 PM  Result Value Ref Range   Yeast Wet Prep HPF POC NONE SEEN NONE SEEN   Trich, Wet Prep NONE SEEN NONE SEEN   Clue Cells Wet Prep HPF POC NONE SEEN NONE SEEN   WBC, Wet Prep HPF POC <10 <10   Sperm NONE SEEN   Protein / creatinine ratio, urine   Collection Time: 03/01/24 10:40 PM  Result Value Ref Range   Creatinine, Urine 104 mg/dL   Total Protein, Urine 24 mg/dL   Protein Creatinine Ratio 0.23 (H) 0.00 - 0.15 mg/mg[Cre]  Urinalysis, Routine w reflex microscopic -Urine, Clean Catch   Collection Time: 03/01/24 10:40 PM  Result Value Ref Range   Color, Urine YELLOW (A) YELLOW   APPearance CLOUDY (A) CLEAR   Specific Gravity, Urine 1.018 1.005 - 1.030   pH 6.0 5.0 - 8.0   Glucose, UA NEGATIVE NEGATIVE mg/dL   Hgb urine dipstick NEGATIVE NEGATIVE   Bilirubin Urine NEGATIVE NEGATIVE   Ketones, ur NEGATIVE NEGATIVE mg/dL   Protein, ur NEGATIVE NEGATIVE mg/dL   Nitrite POSITIVE (A) NEGATIVE   Leukocytes,Ua SMALL (A) NEGATIVE   RBC / HPF 0-5 0 - 5 RBC/hpf   WBC, UA >50 0 - 5 WBC/hpf   Bacteria, UA MANY (A) NONE SEEN   Squamous Epithelial / HPF 0-5 0 - 5 /HPF   Mucus PRESENT   Comprehensive metabolic panel   Collection Time: 03/01/24 11:08 PM  Result Value Ref Range   Sodium 135 135 - 145 mmol/L   Potassium 3.3 (L) 3.5 - 5.1 mmol/L   Chloride 102 98 - 111 mmol/L   CO2 23 22 - 32 mmol/L   Glucose, Bld 77 70 -  99 mg/dL   BUN <5 (L) 6 - 20 mg/dL   Creatinine, Ser 9.61 (L) 0.44 - 1.00 mg/dL   Calcium  8.6 (L) 8.9 - 10.3 mg/dL   Total Protein 6.7 6.5 - 8.1 g/dL   Albumin 3.5 3.5 - 5.0 g/dL   AST 41 15 - 41 U/L   ALT 61 (H) 0 - 44 U/L   Alkaline Phosphatase 159 (H) 38 - 126 U/L   Total Bilirubin 0.2 0.0 - 1.2 mg/dL   GFR, Estimated >39 >39 mL/min   Anion gap 10 5 - 15  CBC   Collection Time: 03/01/24 11:08 PM  Result Value Ref Range   WBC 9.4 4.0 - 10.5 K/uL   RBC 3.53 (L)  3.87 - 5.11 MIL/uL   Hemoglobin 10.3 (L) 12.0 - 15.0 g/dL   HCT 69.4 (L) 63.9 - 53.9 %   MCV 86.4 80.0 - 100.0 fL   MCH 29.2 26.0 - 34.0 pg   MCHC 33.8 30.0 - 36.0 g/dL   RDW 87.0 88.4 - 84.4 %   Platelets 212 150 - 400 K/uL   nRBC 0.0 0.0 - 0.2 %    Prenatal Labs: O/Negative/-- (07/29 1419) Negative (12/02 1035) Blood type/Rh O/Negative/-- (07/29 1419)  Antibody screen Neg 12/2  Rubella Immune 6.29 (07/29 1419)  Varicella Immune Reactive (07/29 1419)  RPR Non Reactive (12/02 1035)  HBsAg Lab Results  Component Value Date   HEPBSAG Negative 10/14/2023    Hep C Non Reactive (07/29 1419)  HIV Non Reactive (12/02 1035)  GC Negative 8/27  Chlamydia Negative 8/27  Genetic screening MaterniT21 low risk, XY  1 hour GTT 76  GBS ordered    Imaging Studies: No results found.  Assessment and Plan: Patient Active Problem List   Diagnosis Date Noted   Pyelonephritis affecting pregnancy 03/02/2024   Abdominal pain affecting pregnancy 03/01/2024   Rh negative, antepartum 01/20/2024   Supervision of other normal pregnancy, antepartum 09/12/2023   History of pre-eclampsia 03/21/2022   Chronic migraine without aura without status migrainosus, not intractable 09/14/2020   Vertigo 03/22/2020   Hearing loss 01/22/2017   Acne vulgaris 12/13/2015   Allergy     1. Admit to Antenatal for presumed pyelonephritis -Betamethasone  x 2 doses -24h urine for protein, UP/C has decreased since checked at 28w. -Repeat CBC &  CMP in AM -IV ceftriaxone  -BID NST -Ultrasound for growth & renal ultrasound in morning -MFM consult in AM -NICU consult dependent on disposition & plan for timing of delivery.  Discussed with Jennea that lab work & her symptoms are concerning for evolving pre-eclampsia vs pyelonephritis. Given right CVA tenderness & UA result with hx of pyelo this pregnancy and inadequate suppression recommend initiating IV antibiotics. Given her history of severe pre-eclampsia necessitating a preterm delivery also recommend betamethasone  and continued monitoring inpatient for laboratory changes.   Pre-eclampsia with severe features in her prior pregnancy was diagnosed by transaminitis, blood pressures remained normotensive to mild range.  Harlene LITTIE Cisco, CNM  Certified Nurse Midwife [facility]       [1]  Allergies Allergen Reactions   Depo-Provera  [Medroxyprogesterone  Acetate] Shortness Of Breath   Orange Fruit [Citrus] Anaphylaxis

## 2024-03-02 NOTE — Consult Note (Signed)
 Patient ID: Faith [name], female   DOB: Aug 22, 2000, 23 y.o.   MRN: [id number]  HPI Faith [name] is a 23 y.o. female seen in consultation at the request of Faith [name].  She presents with right sided abdominal pain some visual changes and dizziness.  She tells me that her abdominal discomfort has been present for about a month it is on and off she does have 2 spots 1 on right chest wall and the other 1 in the right lower quadrant.  She denies any fevers any chills she denies any nausea or vomiting. She does have a history of severe preeclampsia during last pregnancy and was delivered at 34 weeks. Lab work revealed mildly elevated LFTs with an AST of 47 and ALT of 62, alkaline phosphatase of 156 and bilirubin of 0.3 She did have an ultrasound that I have personally reviewed showing evidence of cholelithiasis with a normal common bile duct.  No evidence of cholecystitis She does report fetal movement some cramping. OB team initially thought this was preeclampsia but blood pressure has been within normal limits. HPI  Past Medical History:  Diagnosis Date   Allergy    GERD (gastroesophageal reflux disease)    Just began   Mononucleosis 23yo    Past Surgical History:  Procedure Laterality Date   WISDOM TOOTH EXTRACTION      Family History  Problem Relation Age of Onset   Cancer Mother        Hodgkin's Lymphoma   Hearing loss Father    Depression Sister    Hypertension Maternal Grandmother    Hypertension Paternal Grandmother    Cancer Paternal Grandmother        related to Kidney   Cancer Paternal Grandfather        Lung    Social History Social History[1]  Allergies[2]  Current Facility-Administered Medications  Medication Dose Route Frequency Provider Last Rate Last Admin   acetaminophen  (TYLENOL ) tablet 1,000 mg  1,000 mg Oral Q6H PRN Jayne Harlene CROME, [name]       betamethasone  acetate-betamethasone  sodium phosphate (CELESTONE ) injection 12 mg  12 mg Intramuscular  Q24H Jayne Harlene CROME, [name]   12 mg at 03/02/24 0126   calcium  carbonate (TUMS - dosed in mg elemental calcium ) chewable tablet 400 mg of elemental calcium   400 mg of elemental calcium  Oral TID PRN Jayne Harlene CROME, [name]       cefTRIAXone  (ROCEPHIN ) 2 g in sodium chloride  0.9 % 100 mL IVPB  2 g Intravenous Q24H Jayne Harlene L, [name] 200 mL/hr at 03/02/24 0141 2 g at 03/02/24 0141   docusate sodium  (COLACE) capsule 100 mg  100 mg Oral Daily Jayne Harlene CROME, [name]   100 mg at 03/02/24 1045   famotidine  (PEPCID ) tablet 20 mg  20 mg Oral Daily Jayne Harlene CROME, [name]   20 mg at 03/02/24 1045   lactated ringers  infusion 500-1,000 mL  500-1,000 mL Intravenous PRN Jayne Harlene CROME, [name]       lactated ringers  infusion   Intravenous Continuous Jayne Harlene CROME, [name] 125 mL/hr at 03/02/24 0252 New Bag at 03/02/24 0252   ondansetron  (ZOFRAN -ODT) disintegrating tablet 4 mg  4 mg Oral Q6H PRN Jayne Harlene CROME, [name]       Or   ondansetron  (ZOFRAN ) injection 4 mg  4 mg Intravenous Q6H PRN Jayne Harlene CROME, [name]       prenatal multivitamin tablet 1 tablet  1 tablet Oral Q1200 Jayne Harlene CROME, [name]  sodium citrate-citric acid  (ORACIT) solution 30 mL  30 mL Oral Q2H PRN Jayne Harlene CROME, [name]         Review of Systems Full ROS  was asked and was negative except for the information on the HPI  Physical Exam Blood pressure 109/60, pulse (!) 107, temperature 97.7 F (36.5 C), temperature source Oral, resp. rate 15, last menstrual period 08/04/2023. CONSTITUTIONAL: NAD. EYES: Pupils are equal, round, Sclera are non-icteric. EARS, NOSE, MOUTH AND THROAT: The oropharynx is clear. The oral mucosa is pink and moist. Hearing is intact to voice. LYMPH NODES:  Lymph nodes in the neck are normal. RESPIRATORY:  Lungs are clear. There is normal respiratory effort, with equal breath sounds bilaterally, and without pathologic use of accessory muscles. She is exquisitely tender to palpation on  right chest wall CARDIOVASCULAR: Heart is regular without murmurs, gallops, or rubs. GI: The abdomen is  soft, nontender, and nondistended. There are no palpable masses. There is no hepatosplenomegaly. There are normal bowel sounds. NO rebound , No murphy she is tender to palpation on RLQ w/o peritonitis GU: Rectal deferred.   MUSCULOSKELETAL: Normal muscle strength and tone. No cyanosis or edema.   SKIN: Turgor is good and there are no pathologic skin lesions or ulcers. NEUROLOGIC: Motor and sensation is grossly normal. Cranial nerves are grossly intact. PSYCH:  Oriented to person, place and time. Affect is normal.  Data Reviewed I have personally reviewed the patient's imaging, laboratory findings and medical records.    Assessment/Plan 23 yo with right lower quadrant pain and right sided chest wall pain.  She does have a degree of cholestasis.  Given confusing clinical picture I do think is worth to investigate potential appendicitis or even potential choledocholithiasis.  On clinical exam she does not seem to be peritonitic and she does not seem to have acute cholecystitis.  Had an extensive discussion with the patient and her husband regarding her disease process and they are in agreement. Initially I thought we needed contrast but Dr. Maple from radiologist assured us  that we can get a good diagnosis without IV contrast. She does not need emergent surgical intervention at this time and we will be happy to continue to follow her I personally spent a total of 75 minutes in the care of the patient today including performing a medically appropriate exam/evaluation, counseling and educating, placing orders, referring and communicating with other health care professionals, documenting clinical information in the EHR, independently interpreting and reviewing images studies and coordinating care.    Laneta Luna, MD FACS General Surgeon 03/02/2024, 3:53 PM      [1]  Social  History Tobacco Use   Smoking status: Never   Smokeless tobacco: Never  Vaping Use   Vaping status: Every Day  Substance Use Topics   Alcohol use: No   Drug use: No  [2]  Allergies Allergen Reactions   Depo-Provera  [Medroxyprogesterone  Acetate] Shortness Of Breath   Orange Fruit [Citrus] Anaphylaxis

## 2024-03-03 DIAGNOSIS — Z3A30 30 weeks gestation of pregnancy: Secondary | ICD-10-CM | POA: Diagnosis not present

## 2024-03-03 DIAGNOSIS — Z7982 Long term (current) use of aspirin: Secondary | ICD-10-CM | POA: Diagnosis not present

## 2024-03-03 DIAGNOSIS — R1031 Right lower quadrant pain: Secondary | ICD-10-CM | POA: Diagnosis not present

## 2024-03-03 DIAGNOSIS — K8020 Calculus of gallbladder without cholecystitis without obstruction: Secondary | ICD-10-CM | POA: Diagnosis not present

## 2024-03-03 DIAGNOSIS — O26893 Other specified pregnancy related conditions, third trimester: Secondary | ICD-10-CM | POA: Diagnosis not present

## 2024-03-03 DIAGNOSIS — O26613 Liver and biliary tract disorders in pregnancy, third trimester: Secondary | ICD-10-CM | POA: Diagnosis not present

## 2024-03-03 DIAGNOSIS — O2303 Infections of kidney in pregnancy, third trimester: Secondary | ICD-10-CM | POA: Diagnosis not present

## 2024-03-03 LAB — CBC WITH DIFFERENTIAL/PLATELET
Abs Immature Granulocytes: 0.08 K/uL — ABNORMAL HIGH (ref 0.00–0.07)
Basophils Absolute: 0 K/uL (ref 0.0–0.1)
Basophils Relative: 0 %
Eosinophils Absolute: 0 K/uL (ref 0.0–0.5)
Eosinophils Relative: 0 %
HCT: 26.7 % — ABNORMAL LOW (ref 36.0–46.0)
Hemoglobin: 9 g/dL — ABNORMAL LOW (ref 12.0–15.0)
Immature Granulocytes: 1 %
Lymphocytes Relative: 15 %
Lymphs Abs: 1.6 K/uL (ref 0.7–4.0)
MCH: 28.8 pg (ref 26.0–34.0)
MCHC: 33.7 g/dL (ref 30.0–36.0)
MCV: 85.3 fL (ref 80.0–100.0)
Monocytes Absolute: 0.5 K/uL (ref 0.1–1.0)
Monocytes Relative: 5 %
Neutro Abs: 8.2 K/uL — ABNORMAL HIGH (ref 1.7–7.7)
Neutrophils Relative %: 79 %
Platelets: 188 K/uL (ref 150–400)
RBC: 3.13 MIL/uL — ABNORMAL LOW (ref 3.87–5.11)
RDW: 12.8 % (ref 11.5–15.5)
WBC: 10.3 K/uL (ref 4.0–10.5)
nRBC: 0 % (ref 0.0–0.2)

## 2024-03-03 LAB — COMPREHENSIVE METABOLIC PANEL WITH GFR
ALT: 59 U/L — ABNORMAL HIGH (ref 0–44)
AST: 38 U/L (ref 15–41)
Albumin: 3.2 g/dL — ABNORMAL LOW (ref 3.5–5.0)
Alkaline Phosphatase: 129 U/L — ABNORMAL HIGH (ref 38–126)
Anion gap: 9 (ref 5–15)
BUN: <5 mg/dL — ABNORMAL LOW (ref 6–20)
CO2: 22 mmol/L (ref 22–32)
Calcium: 8.4 mg/dL — ABNORMAL LOW (ref 8.9–10.3)
Chloride: 105 mmol/L (ref 98–111)
Creatinine, Ser: 0.33 mg/dL — ABNORMAL LOW (ref 0.44–1.00)
GFR, Estimated: >60 mL/min (ref >60)
Glucose, Bld: 115 mg/dL — ABNORMAL HIGH (ref 70–99)
Potassium: 3.8 mmol/L (ref 3.5–5.1)
Sodium: 136 mmol/L (ref 135–145)
Total Bilirubin: <0.2 mg/dL (ref 0.0–1.2)
Total Protein: 5.9 g/dL — ABNORMAL LOW (ref 6.5–8.1)

## 2024-03-03 LAB — PROTEIN, URINE, 24 HOUR
Collection Interval-UPROT: 24 h
Protein, 24H Urine: 220 mg/(24.h) — ABNORMAL HIGH (ref <150)
Protein, Urine: 11 mg/dL
Urine Total Volume-UPROT: 2000 mL

## 2024-03-03 MED ORDER — PANTOPRAZOLE SODIUM 40 MG PO TBEC
40.0000 mg | DELAYED_RELEASE_TABLET | Freq: Every day | ORAL | Status: DC
  Administered 2024-03-03: 10:00:00 40 mg via ORAL
  Filled 2024-03-03: qty 1

## 2024-03-03 MED ORDER — PANTOPRAZOLE SODIUM 40 MG PO TBEC: 40.0000 mg | DELAYED_RELEASE_TABLET | Freq: Every day | ORAL | 1 refills | Status: DC

## 2024-03-03 MED ORDER — PROMETHAZINE HCL 25 MG PO TABS: 25.0000 mg | ORAL_TABLET | Freq: Four times a day (QID) | ORAL | 1 refills | Status: DC | PRN

## 2024-03-03 MED ORDER — SULFAMETHOXAZOLE-TRIMETHOPRIM 800-160 MG PO TABS: 1.0000 | ORAL_TABLET | Freq: Two times a day (BID) | ORAL | 0 refills | Status: AC

## 2024-03-03 NOTE — Progress Notes (Signed)
 [facility] SURGICAL PROGRESS NOTE (cpt [phone])  Hospital Day(s): 0.   Interval History: Patient seen and examined, no acute events or new complaints overnight. Patient reports she feels about the same. She continues to note similar, intermittent, RUQ and occasionally RLQ, abdominal pain. Nausea this AM. No fever, chills, CP, SOB, emesis. She remains without leukocytosis; WBC 10.3K. Hgb to 9.0; suspect this is dilutional. Renal function normal; sCr - 0.33. LFTs improving. Bilirubin <0.2. She did have MRCP without evidence of cholecystitis, appendix not visualized.   Review of Systems:  Constitutional: denies fever, chills  HEENT: denies cough or congestion  Respiratory: denies any shortness of breath  Cardiovascular: denies chest pain or palpitations  Gastrointestinal: + abdominal pain, denied N/V Genitourinary: denies burning with urination or urinary frequency Musculoskeletal: denies pain, decreased motor or sensation   Vital signs in last 24 hours: [min-max] current  Temp:  [97.7 F (36.5 C)-98.4 F (36.9 C)] 98.4 F (36.9 C) (12/17 0706) Pulse Rate:  [86-112] 100 (12/17 0706) Resp:  [15-16] 16 (12/17 0706) BP: (83-132)/(34-76) 110/73 (12/17 0706) Weight:  [72.6 kg] 72.6 kg (12/17 0732)     Height: 5' 4 (162.6 cm) Weight: 72.6 kg BMI (Calculated): 27.45   Intake/Output last 2 shifts:  No intake/output data recorded.   Physical Exam:  Constitutional: alert, cooperative and no distress  HENT: normocephalic without obvious abnormality  Respiratory: breathing non-labored at rest  Cardiovascular: regular rate and sinus rhythm  Gastrointestinal: Abdomen is gravid, soft, she remains sore in RLQ, without peritonitis, no rebound/guarding    Labs:     Latest Ref Rng & Units 03/03/2024    5:02 AM 03/02/2024    6:21 AM 03/01/2024   11:08 PM  CBC  WBC 4.0 - 10.5 K/uL 10.3  9.4  9.4   Hemoglobin 12.0 - 15.0 g/dL 9.0  89.4  89.6   Hematocrit 36.0 - 46.0 % 26.7  31.8   30.5   Platelets 150 - 400 K/uL 188  201  212       Latest Ref Rng & Units 03/03/2024    5:02 AM 03/02/2024    6:21 AM 03/01/2024   11:08 PM  CMP  Glucose 70 - 99 mg/dL 884  888  77   BUN 6 - 20 mg/dL <5  5  <5   Creatinine 0.44 - 1.00 mg/dL 9.66  9.63  9.61   Sodium 135 - 145 mmol/L 136  135  135   Potassium 3.5 - 5.1 mmol/L 3.8  4.0  3.3   Chloride 98 - 111 mmol/L 105  104  102   CO2 22 - 32 mmol/L 22  21  23    Calcium  8.9 - 10.3 mg/dL 8.4  9.0  8.6   Total Protein 6.5 - 8.1 g/dL 5.9  6.8  6.7   Total Bilirubin 0.0 - 1.2 mg/dL <9.7  0.3  0.2   Alkaline Phos 38 - 126 U/L 129  156  159   AST 15 - 41 U/L 38  47  41   ALT 0 - 44 U/L 59  62  61      Imaging studies: No new pertinent imaging studies   Assessment/Plan: (ICD-10's: K71.20) 23 y.o. female with cholelithiasis without evidence of cholecystitis nor choledocholithiasis, complicated by current pregnancy (30w 1d).    - No evidence of cholecystitis nor choledocholithiasis on imaging, without leukocytosis. She may have biliary colic/symptomatic cholelithiasis. I do not think she has appendicitis. Given she is in the third trimester,  we would defer surgical intervention until post-partum. I have reviewed dietary recommends (ie: Low Fat Diet) for home to minimize recurrence/symptoms. We will plan to follow up in January for discussion of interval cholecystectomy after she delivers. She is understanding and agreeable.   All of the above findings and recommendations were discussed with the patient, and the medical team, and all of patient's questions were answered to her expressed satisfaction.  -- Arthea Platt, PA-C [facility] 03/03/2024, 8:39 AM M-F: 7am - 4pm

## 2024-03-03 NOTE — Final Progress Note (Signed)
°  Progress Note   Patient: Faith [name] FMW:[id number] DOB: Oct 08, 2000 DOA: 03/01/2024     0 DOS: the patient was seen and examined on 03/03/2024   Brief hospital course: Faith [name] is a 23 y.o. G2P0101 at [redacted]w[redacted]d who was admitted on 03/01/24 for abdominal pain, visual changes, and dizziness. She was found to have mildly elevated liver enzymes but no other s/s of preeclampsia. She does have a UTI and cholelithiasis without signs of acute cholecystitis. She WBC is normal and she has been afebrile. She received IV ceftriaxone  and antiemetics. General surgery was consulted and recommends low fat diet and postpartum evaluation.  Assessment and Plan: UTI vs pyelonephritis Cholelithiasis Discussed findings with Dr. Fredirick. She recommends discharge home with PO antibiotics for UTI as pain is likely r/t biliary colic from gallstone, patient is afebrile, and WBC count is normal. Strict gallbladder diet. F/u with general surgery postpartum  Faith [name] is in agreement with this plan  ROB visit 03/05/24    Physical Exam: BP 110/73 (BP Location: Right Arm)   Pulse 100   Temp 98.4 F (36.9 C) (Oral)   Resp 16   Ht 5' 4 (1.626 m)   Wt 72.6 kg   LMP 08/04/2023   BMI 27.46 kg/m    General: alert, cooperative, NAD Abdomen: soft, gravid, tender to palpation on RUQ and RLQ, no guarding, no rebound tenderness   Disposition: Discharge home Instructed to return to hospital with worsening pain, intractable vomiting, fever, s/s of labor, decreased fetal movement, vaginal bleeding, LOF, s/s of preeclampsia   Eleanor CHRISTELLA Canny, CNM 03/03/2024 10:21 AM

## 2024-03-03 NOTE — Progress Notes (Signed)
 Discharge instructions reviewed with Faith [name] who verbalized understanding. She denies any further questions or concerns at this time and states she understands when to follow up. Discharged home with significant other.

## 2024-03-03 NOTE — Discharge Summary (Signed)
 Discharge Summary  Patient ID: Faith [name] MRN: [id number] DOB/AGE: 04/10/2000 23 y.o.  Admit date: 03/01/2024 Discharge date: 03/03/2024  Admission Diagnoses:  Discharge Diagnoses:  Principal Problem:   Abdominal pain during pregnancy in third trimester Active Problems:   Pyelonephritis affecting pregnancy   Cholelithiasis affecting pregnancy in third trimester, antepartum   Discharged Condition: good  Hospital Course: Faith [name] was admitted to the hospital on 03/01/24 with RUQ, visual changes, and dizziness. Preeclampsia was ruled out based on normal BPs and 24-hour urine. She received IV abx for presumed pyelonephritis. Imaging showed cholelithiasis without acute cholecystitis. Gen surgery was consulted and recommends postpartum f/u with strict low-fat diet in the interim.  Consults: general surgery  Significant Diagnostic Studies: labs: CBC    Component Value Date/Time   WBC 10.3 03/03/2024 0502   RBC 3.13 (L) 03/03/2024 0502   HGB 9.0 (L) 03/03/2024 0502   HGB 10.8 (L) 02/17/2024 1035   HCT 26.7 (L) 03/03/2024 0502   HCT 33.2 (L) 02/17/2024 1035   PLT 188 03/03/2024 0502   PLT 221 02/17/2024 1035   MCV 85.3 03/03/2024 0502   MCV 89 02/17/2024 1035   MCH 28.8 03/03/2024 0502   MCHC 33.7 03/03/2024 0502   RDW 12.8 03/03/2024 0502   RDW 11.9 02/17/2024 1035   LYMPHSABS 1.6 03/03/2024 0502   LYMPHSABS 1.9 02/17/2024 1035   MONOABS 0.5 03/03/2024 0502   EOSABS 0.0 03/03/2024 0502   EOSABS 0.1 02/17/2024 1035   BASOSABS 0.0 03/03/2024 0502   BASOSABS 0.0 02/17/2024 1035      CMP     Component Value Date/Time   NA 136 03/03/2024 0502   NA 135 02/17/2024 1035   K 3.8 03/03/2024 0502   CL 105 03/03/2024 0502   CO2 22 03/03/2024 0502   GLUCOSE 115 (H) 03/03/2024 0502   BUN <5 (L) 03/03/2024 0502   BUN 8 02/17/2024 1035   CREATININE 0.33 (L) 03/03/2024 0502   CALCIUM  8.4 (L) 03/03/2024 0502   PROT 5.9 (L) 03/03/2024 0502   PROT 6.2 02/17/2024 1035    ALBUMIN 3.2 (L) 03/03/2024 0502   ALBUMIN 3.4 (L) 02/17/2024 1035   AST 38 03/03/2024 0502   ALT 59 (H) 03/03/2024 0502   ALKPHOS 129 (H) 03/03/2024 0502   BILITOT <0.2 03/03/2024 0502   BILITOT <0.2 02/17/2024 1035   EGFR 137 02/17/2024 1035   GFRNONAA >60 03/03/2024 0502   and radiology: see MRI/MRCP  Treatments: IV hydration and antibiotics: ceftriaxone   Discharge Exam: Blood pressure 110/73, pulse 100, temperature 98.4 F (36.9 C), temperature source Oral, resp. rate 16, height 5' 4 (1.626 m), weight 72.6 kg, last menstrual period 08/04/2023. General appearance: alert, cooperative, and appears stated age Resp: clear to auscultation bilaterally GI: soft, gravid, tender to palpation in RUQ and RLQ, no rebound tenderness, no guarding  Disposition: Discharge disposition: 01-Home or Self Care       Discharge Instructions     Activity as tolerated   Complete by: As directed    Call MD for:  persistant nausea and vomiting   Complete by: As directed    Call MD for:  severe uncontrolled pain   Complete by: As directed    Call MD for:  temperature >100.4   Complete by: As directed    Diet general   Complete by: As directed       Allergies as of 03/03/2024       Reactions   Depo-provera  [medroxyprogesterone  Acetate] Shortness Of Breath   Orange Fruit [  citrus] Anaphylaxis        Medication List     TAKE these medications    aspirin  EC 81 MG tablet Take 1 tablet (81 mg total) by mouth daily. Take after 12 weeks for prevention of preeclampssia later in pregnancy   Diclegis 10-10 MG Tbec Generic drug: Doxylamine-Pyridoxine Take 2 tablets by mouth at bedtime.   EPINEPHrine  0.3 mg/0.3 mL Soaj injection Commonly known as: EPI-PEN Inject 0.3 mg into the muscle as needed for anaphylaxis.   famotidine  20 MG tablet Commonly known as: PEPCID  TAKE 1 TABLET BY MOUTH TWICE A DAY   nitrofurantoin  (macrocrystal-monohydrate) 100 MG capsule Commonly known as:  Macrobid  Take 1 capsule (100 mg total) by mouth at bedtime.   ondansetron  4 MG disintegrating tablet Commonly known as: ZOFRAN -ODT Take 1 tablet (4 mg total) by mouth every 8 (eight) hours as needed.   pantoprazole  40 MG tablet Commonly known as: Protonix  Take 1 tablet (40 mg total) by mouth daily.   PRENATAL PO Take 2 each by mouth daily. Taking 2 gummies daily.   promethazine  25 MG suppository Commonly known as: PHENERGAN  Place 1 suppository (25 mg total) rectally every 6 (six) hours as needed for nausea or vomiting. What changed: Another medication with the same name was added. Make sure you understand how and when to take each.   promethazine  25 MG tablet Commonly known as: PHENERGAN  Take 1 tablet (25 mg total) by mouth every 6 (six) hours as needed for nausea or vomiting. What changed: You were already taking a medication with the same name, and this prescription was added. Make sure you understand how and when to take each.   sulfamethoxazole -trimethoprim  800-160 MG tablet Commonly known as: BACTRIM  DS Take 1 tablet by mouth 2 (two) times daily for 14 days.        Follow-up Information     Pabon, [place] F, MD. Schedule an appointment as soon as possible for a visit in 5 week(s).   Specialty: General Surgery Why: Follow up in January, cholelithiasis Contact information: [address] [place] [address] [phone]                 Signed: Eleanor CHRISTELLA Canny 03/03/2024, 10:35 AM

## 2024-03-04 ENCOUNTER — Ambulatory Visit: Payer: Self-pay | Admitting: Certified Nurse Midwife

## 2024-03-04 LAB — URINE CULTURE: Culture: >=100000 — AB

## 2024-03-04 LAB — CULTURE, BETA STREP (GROUP B ONLY)

## 2024-03-04 LAB — BILE ACIDS, TOTAL: Bile Acids Total: 4.3 umol/L (ref 0.0–10.0)

## 2024-03-05 ENCOUNTER — Telehealth: Payer: Self-pay

## 2024-03-05 ENCOUNTER — Encounter: Admitting: Obstetrics

## 2024-03-05 NOTE — Telephone Encounter (Signed)
 Patient called with concerns of swelling in her right labia after taking bactrim  and Protonix  x 2 days. I advised her to take some Benadryl to see if that would help with the swelling, do some bath soaks, rest on her side. If rash develops and spreads over entire body, go to the ED. She verbalized understanding.

## 2024-03-08 ENCOUNTER — Encounter: Payer: Self-pay | Admitting: Licensed Practical Nurse

## 2024-03-08 ENCOUNTER — Ambulatory Visit (INDEPENDENT_AMBULATORY_CARE_PROVIDER_SITE_OTHER): Admitting: Licensed Practical Nurse

## 2024-03-08 ENCOUNTER — Other Ambulatory Visit (HOSPITAL_COMMUNITY)
Admission: RE | Admit: 2024-03-08 | Discharge: 2024-03-08 | Disposition: A | Discharge status: Home / Self Care | Source: Ambulatory Visit | Attending: Licensed Practical Nurse | Admitting: Licensed Practical Nurse

## 2024-03-08 VITALS — BP 112/84 | HR 108 | Wt 168.5 lb

## 2024-03-08 DIAGNOSIS — N390 Urinary tract infection, site not specified: Secondary | ICD-10-CM

## 2024-03-08 DIAGNOSIS — N898 Other specified noninflammatory disorders of vagina: Secondary | ICD-10-CM | POA: Insufficient documentation

## 2024-03-08 DIAGNOSIS — O26893 Other specified pregnancy related conditions, third trimester: Secondary | ICD-10-CM | POA: Insufficient documentation

## 2024-03-08 DIAGNOSIS — O2393 Unspecified genitourinary tract infection in pregnancy, third trimester: Secondary | ICD-10-CM | POA: Diagnosis not present

## 2024-03-08 DIAGNOSIS — Z3480 Encounter for supervision of other normal pregnancy, unspecified trimester: Secondary | ICD-10-CM

## 2024-03-08 DIAGNOSIS — N12 Tubulo-interstitial nephritis, not specified as acute or chronic: Secondary | ICD-10-CM | POA: Diagnosis not present

## 2024-03-08 DIAGNOSIS — Z3A31 31 weeks gestation of pregnancy: Secondary | ICD-10-CM | POA: Diagnosis not present

## 2024-03-08 DIAGNOSIS — B3731 Acute candidiasis of vulva and vagina: Secondary | ICD-10-CM

## 2024-03-08 MED ORDER — NITROFURANTOIN MONOHYD MACRO 100 MG PO CAPS: 100.0000 mg | ORAL_CAPSULE | Freq: Every day | ORAL | 1 refills | Status: AC

## 2024-03-08 NOTE — Assessment & Plan Note (Signed)
-  taking Bactrim  culture from 12/15 shows    CITROBACTER KOSERI Abnormal    -Will need suppression therapy, new script for Macrobid  sent

## 2024-03-08 NOTE — Assessment & Plan Note (Signed)
-  TWG 8lbs, last 1 lbs-has made dietary changes which could influence weight loss  -has family to watch her older child while in labor -aptima collected, most likely has a yeast infection d/t antibiotics, rec Monistat 7  -warning signs reviewed

## 2024-03-08 NOTE — Progress Notes (Signed)
" ° ° °  Return Prenatal Note   Subjective   23 y.o. G2P0101 at [redacted]w[redacted]d presents for this follow-up prenatal visit.  Patient Was inpatient 12/15-12/17 for pyelo and gallstones  Patient reports: -Right sided pain improved with diet -vaginal soreness; started on Thursday, feels swollen has thick white discharge no odor, labia and vagina irritated looking, a little swollen, thick white discharge adherent to labia present  -light headedness not as bad   Movement: Present Contractions: Not present  Objective   Flow sheet Vitals: Pulse Rate: (!) 108 BP: 112/84 Fundal Height: 33 cm Fetal Heart Rate (bpm): 150 Total weight gain: 8 lb 8 oz (3.856 kg)  General Appearance  No acute distress, well appearing, and well nourished Pulmonary   Normal work of breathing Neurologic   Alert and oriented to person, place, and time Psychiatric   Mood and affect within normal limits   Assessment/Plan   Plan  23 y.o. G2P0101 at [redacted]w[redacted]d presents for follow-up OB visit. Reviewed prenatal record including previous visit note.  Pyelonephritis affecting pregnancy -taking Bactrim  culture from 12/15 shows    CITROBACTER KOSERI Abnormal    -Will need suppression therapy, new script for Macrobid  sent   Supervision of other normal pregnancy, antepartum -TWG 8lbs, last 1 lbs-has made dietary changes which could influence weight loss  -has family to watch her older child while in labor -aptima collected, most likely has a yeast infection d/t antibiotics, rec Monistat 7  -warning signs reviewed       No orders of the defined types were placed in this encounter.  No follow-ups on file.   Future Appointments  Date Time Provider Department Center  03/23/2024  3:15 PM Lauren Aguayo, Jinnie Jansky, CNM [facility] None    For next visit:  continue with routine prenatal care     JINNIE HERO [facility], CNM  12/22/20254:58 PM  "

## 2024-03-09 ENCOUNTER — Inpatient Hospital Stay
Admission: EM | Admit: 2024-03-09 | Discharge: 2024-03-10 | Disposition: A | Discharge status: Home / Self Care | Attending: Obstetrics and Gynecology | Admitting: Obstetrics and Gynecology

## 2024-03-09 ENCOUNTER — Emergency Department

## 2024-03-09 ENCOUNTER — Other Ambulatory Visit: Payer: Self-pay

## 2024-03-09 DIAGNOSIS — O26613 Liver and biliary tract disorders in pregnancy, third trimester: Secondary | ICD-10-CM | POA: Diagnosis not present

## 2024-03-09 DIAGNOSIS — O26893 Other specified pregnancy related conditions, third trimester: Secondary | ICD-10-CM | POA: Insufficient documentation

## 2024-03-09 DIAGNOSIS — Z3A31 31 weeks gestation of pregnancy: Secondary | ICD-10-CM | POA: Diagnosis not present

## 2024-03-09 DIAGNOSIS — K8020 Calculus of gallbladder without cholecystitis without obstruction: Secondary | ICD-10-CM | POA: Diagnosis not present

## 2024-03-09 DIAGNOSIS — R1011 Right upper quadrant pain: Secondary | ICD-10-CM | POA: Diagnosis not present

## 2024-03-09 LAB — URINALYSIS, ROUTINE W REFLEX MICROSCOPIC
Bilirubin Urine: NEGATIVE
Glucose, UA: 50 mg/dL — AB
Hgb urine dipstick: NEGATIVE
Ketones, ur: NEGATIVE mg/dL
Leukocytes,Ua: NEGATIVE
Nitrite: NEGATIVE
Protein, ur: NEGATIVE mg/dL
Specific Gravity, Urine: 1.025 (ref 1.005–1.030)
pH: 5 (ref 5.0–8.0)

## 2024-03-09 LAB — COMPREHENSIVE METABOLIC PANEL WITH GFR
ALT: 89 U/L — ABNORMAL HIGH (ref 0–44)
AST: 44 U/L — ABNORMAL HIGH (ref 15–41)
Albumin: 3.6 g/dL (ref 3.5–5.0)
Alkaline Phosphatase: 154 U/L — ABNORMAL HIGH (ref 38–126)
Anion gap: 15 (ref 5–15)
BUN: 7 mg/dL (ref 6–20)
CO2: 19 mmol/L — ABNORMAL LOW (ref 22–32)
Calcium: 8.9 mg/dL (ref 8.9–10.3)
Chloride: 102 mmol/L (ref 98–111)
Creatinine, Ser: 0.34 mg/dL — ABNORMAL LOW (ref 0.44–1.00)
GFR, Estimated: >60 mL/min
Glucose, Bld: 82 mg/dL (ref 70–99)
Potassium: 3.4 mmol/L — ABNORMAL LOW (ref 3.5–5.1)
Sodium: 136 mmol/L (ref 135–145)
Total Bilirubin: 0.3 mg/dL (ref 0.0–1.2)
Total Protein: 6.9 g/dL (ref 6.5–8.1)

## 2024-03-09 LAB — CBC
HCT: 32.8 % — ABNORMAL LOW (ref 36.0–46.0)
Hemoglobin: 10.5 g/dL — ABNORMAL LOW (ref 12.0–15.0)
MCH: 28.5 pg (ref 26.0–34.0)
MCHC: 32 g/dL (ref 30.0–36.0)
MCV: 88.9 fL (ref 80.0–100.0)
Platelets: 205 K/uL (ref 150–400)
RBC: 3.69 MIL/uL — ABNORMAL LOW (ref 3.87–5.11)
RDW: 12.8 % (ref 11.5–15.5)
WBC: 9.9 K/uL (ref 4.0–10.5)
nRBC: 0 % (ref 0.0–0.2)

## 2024-03-09 LAB — CERVICOVAGINAL ANCILLARY ONLY
Bacterial Vaginitis (gardnerella): POSITIVE — AB
Candida Glabrata: NEGATIVE
Candida Vaginitis: POSITIVE — AB
Comment: NEGATIVE
Comment: NEGATIVE
Comment: NEGATIVE

## 2024-03-09 LAB — LIPASE, BLOOD: Lipase: 23 U/L (ref 11–51)

## 2024-03-09 MED ORDER — SODIUM CHLORIDE 0.9 % IV BOLUS
1000.0000 mL | Freq: Once | INTRAVENOUS | Status: AC
  Administered 2024-03-09: 1000 mL via INTRAVENOUS

## 2024-03-09 MED ORDER — MORPHINE SULFATE (PF) 2 MG/ML IV SOLN
2.0000 mg | Freq: Once | INTRAVENOUS | Status: AC
  Administered 2024-03-09: 2 mg via INTRAVENOUS
  Filled 2024-03-09: qty 1

## 2024-03-09 MED ORDER — ONDANSETRON HCL 4 MG/2ML IJ SOLN
4.0000 mg | Freq: Once | INTRAMUSCULAR | Status: AC
  Administered 2024-03-09: 4 mg via INTRAVENOUS
  Filled 2024-03-09: qty 2

## 2024-03-09 NOTE — ED Provider Notes (Signed)
 "  [facility] Provider Note    Event Date/Time   First MD Initiated Contact with Patient 03/09/24 2050     (approximate)   History   Abdominal Pain   HPI  Faith [name] is a 23 y.o. female presents to the emergency department today because of concerns for right upper quadrant abdominal pain.  Pain started this morning.  Rated at 8 out of 10.  It has been consistent throughout the day.  The patient was discharged from the hospital roughly 1 week ago with similar symptoms.  Was found to have gallstones at that time.  Patient does state that she ate a cheeseburger last night and then had a another cheeseburger and French fries shortly before my examination here in the emergency department.  Patient denies any fevers or chills.  Has had associated nausea and vomiting.     Physical Exam   Triage Vital Signs: ED Triage Vitals  Encounter Vitals Group     BP 03/09/24 1653 125/84     Girls Systolic BP Percentile --      Girls Diastolic BP Percentile --      Boys Systolic BP Percentile --      Boys Diastolic BP Percentile --      Pulse Rate 03/09/24 1653 (!) 120     Resp 03/09/24 1653 20     Temp 03/09/24 1653 97.7 F (36.5 C)     Temp Source 03/09/24 1653 Oral     SpO2 03/09/24 1653 100 %     Weight 03/09/24 1655 168 lb (76.2 kg)     Height 03/09/24 1655 5' 4 (1.626 m)     Head Circumference --      Peak Flow --      Pain Score 03/09/24 1655 8     Pain Loc --      Pain Education --      Exclude from Growth Chart --     Most recent vital signs: Vitals:   03/09/24 1653 03/09/24 2121  BP: 125/84 125/65  Pulse: (!) 120 (!) 107  Resp: 20 18  Temp: 97.7 F (36.5 C) 98 F (36.7 C)  SpO2: 100% 100%   General: Awake, alert, oriented. CV:  Good peripheral perfusion. Tachycardic. Resp:  Normal effort. Lungs clear. Abd:  Gravid. Tender to palpation in the right upper quadrant.   ED Results / Procedures / Treatments   Labs (all labs ordered are  listed, but only abnormal results are displayed) Labs Reviewed  COMPREHENSIVE METABOLIC PANEL WITH GFR - Abnormal; Notable for the following components:      Result Value   Potassium 3.4 (*)    CO2 19 (*)    Creatinine, Ser 0.34 (*)    AST 44 (*)    ALT 89 (*)    Alkaline Phosphatase 154 (*)    All other components within normal limits  CBC - Abnormal; Notable for the following components:   RBC 3.69 (*)    Hemoglobin 10.5 (*)    HCT 32.8 (*)    All other components within normal limits  LIPASE, BLOOD  URINALYSIS, ROUTINE W REFLEX MICROSCOPIC  POC URINE PREG, ED     EKG  I, Guadalupe Eagles, attending physician, personally viewed and interpreted this EKG  EKG Time: 1705 Rate: 113 Rhythm: sinus tachycardia Axis: normal Intervals: qtc 422 QRS: narrow ST changes: no st elevation Impression: abnormal ekg   RADIOLOGY I independently interpreted and visualized the RUQ US . My interpretation: gallstones  Radiology interpretation:  IMPRESSION:  Cholelithiasis without evidence of acute cholecystitis.      PROCEDURES:  Critical Care performed: No    MEDICATIONS ORDERED IN ED: Medications  sodium chloride  0.9 % bolus 1,000 mL (has no administration in time range)     IMPRESSION / MDM / ASSESSMENT AND PLAN / ED COURSE  I reviewed the triage vital signs and the nursing notes.                              Differential diagnosis includes, but is not limited to, biliary colic, pyelonephritis, gastric ulcer disease  Patient's presentation is most consistent with acute presentation with potential threat to life or bodily function.   Patient presented to the emergency department today because of concern for right sided abdominal pain. On exam does have some tenderness on the right side. US  does not show cholecystitis. The patient did eat cheeseburger which I think could cause worsening biliary colic. Will give pain medication. Discussed with Ob/gyn. Will plan to send  patient to [facility] for further monitoring.    FINAL CLINICAL IMPRESSION(S) / ED DIAGNOSES   Final diagnoses:  RUQ abdominal pain  Gallstones      Note:  This document was prepared using Dragon voice recognition software and may include unintentional dictation errors.    Floy Roberts, MD 03/09/24 [phone]  "

## 2024-03-09 NOTE — OB Triage Note (Signed)
 Patient is a G2P1, 31wks 1 day, presenting from the ED after assessment of gallstones. Patient is stating her pain is 2/10 post pain med administration. Pt denies leaking of fluid and vaginal bleeding. Patient placed on monitor for NST per provider. Vital signs stable at this time.

## 2024-03-09 NOTE — ED Triage Notes (Signed)
 Pt c/o RUQ pain referred to R shoulder with bilious vomiting. Pt was diagnosed with gallstones last week. Pt has f/u with gen surg in Jan. Pt is [redacted] weeks gestation. G2 P1 A0. Pt took tylenol  without any pain relief.

## 2024-03-10 ENCOUNTER — Other Ambulatory Visit: Payer: Self-pay

## 2024-03-10 DIAGNOSIS — K8070 Calculus of gallbladder and bile duct without cholecystitis without obstruction: Secondary | ICD-10-CM

## 2024-03-10 DIAGNOSIS — Z3A31 31 weeks gestation of pregnancy: Secondary | ICD-10-CM

## 2024-03-10 DIAGNOSIS — O99613 Diseases of the digestive system complicating pregnancy, third trimester: Secondary | ICD-10-CM

## 2024-03-10 NOTE — OB Triage Provider Note (Signed)
 "      L&D OB Triage Note  SUBJECTIVE Scott Fix is a 23 y.o. G76P0101 female at [redacted]w[redacted]d, EDD Estimated Date of Delivery: 05/10/24 who presented to triage with complaints of right upper quadrant pain that started this morning Pt has history of gallstones that was diagnosed approximately 1 week ago and notes the pain is similar. She was seen and treated in the ED for this pain then sent to L&D for fetal evaluation. She denies contractions, loss of fluid , and vaginal bleeding. She is feeling much better since having her pain managed in the ED.    OB History  Gravida Para Term Preterm AB Living  2 1 0 1 0 1  SAB IAB Ectopic Multiple Live Births  0 0 0 0 1    # Outcome Date GA Lbr Len/2nd Weight Sex Type Anes PTL Lv  2 Current           1 Preterm 11/25/21 [redacted]w[redacted]d  2240 g M Vag-Spont EPI Y LIV     Complications: Pre-eclampsia     Name: Malena,BB   Debrina ADELINE     Apgar1: 6  Apgar5: 7    Medications Prior to Admission  Medication Sig Dispense Refill Last Dose/Taking   aspirin  EC 81 MG tablet Take 1 tablet (81 mg total) by mouth daily. Take after 12 weeks for prevention of preeclampssia later in pregnancy 300 tablet 2    DICLEGIS 10-10 MG TBEC Take 2 tablets by mouth at bedtime. (Patient not taking: Reported on 02/18/2024)      EPINEPHrine  0.3 mg/0.3 mL IJ SOAJ injection Inject 0.3 mg into the muscle as needed for anaphylaxis. 1 each 10    famotidine  (PEPCID ) 20 MG tablet TAKE 1 TABLET BY MOUTH TWICE A DAY (Patient not taking: Reported on 02/18/2024) 180 tablet 1    nitrofurantoin , macrocrystal-monohydrate, (MACROBID ) 100 MG capsule Take 1 capsule (100 mg total) by mouth at bedtime. 90 capsule 1    ondansetron  (ZOFRAN -ODT) 4 MG disintegrating tablet Take 1 tablet (4 mg total) by mouth every 8 (eight) hours as needed. (Patient not taking: Reported on 01/20/2024) 30 tablet 1    pantoprazole  (PROTONIX ) 40 MG tablet Take 1 tablet (40 mg total) by mouth daily. 30 tablet 1    Prenatal Vit-Fe  Fumarate-FA (PRENATAL PO) Take 2 each by mouth daily. Taking 2 gummies daily.      promethazine  (PHENERGAN ) 25 MG suppository Place 1 suppository (25 mg total) rectally every 6 (six) hours as needed for nausea or vomiting. (Patient not taking: Reported on 01/20/2024) 12 each 4    promethazine  (PHENERGAN ) 25 MG tablet Take 1 tablet (25 mg total) by mouth every 6 (six) hours as needed for nausea or vomiting. 30 tablet 1    sulfamethoxazole -trimethoprim  (BACTRIM  DS) 800-160 MG tablet Take 1 tablet by mouth 2 (two) times daily for 14 days. 28 tablet 0      OBJECTIVE  Nursing Evaluation:   BP 124/71 (BP Location: Left Arm)   Pulse 96   Temp 97.9 F (36.6 C) (Oral)   Resp 18   Ht 5' 4 (1.626 m)   Wt 76.2 kg   LMP 08/04/2023   SpO2 100%   BMI 28.84 kg/m    Findings:        Reactive NST       NST was performed and has been reviewed by me.  NST INTERPRETATION: Category I   Baseline 130 Accelerations 10 x10 Decelerations absent Moderate variability  Ctx: absent  ASSESSMENT Impression:  1.  Pregnancy:  G2P0101 at [redacted]w[redacted]d , EDD Estimated Date of Delivery: 05/10/24 2.  Reassuring fetal and maternal status 3.  Gallstones  PLAN 1. Current condition and above findings reviewed.  Reassuring fetal and maternal condition.US  does not show cholecystitis. The patient did eat cheeseburger which I think could cause worsening biliary colic. Pain medication relieved colic pain. Reviewed diet.   2. Discharge home with standard labor precautions given to return to L&D or call the office for problems. 3. Continue routine prenatal care as scheduled 03/23/24 at [facility].   Zelda Hummer, CNM    "

## 2024-03-10 NOTE — OB Triage Note (Signed)
 Patient to be discharged to home per provider. Patient to follow up with OBGYN office for possible management of gallstone flare ups outpatient. Patient given AVS. Patient observed with stable gait and significant other accompanying her.

## 2024-03-11 ENCOUNTER — Other Ambulatory Visit: Payer: Self-pay | Admitting: Licensed Practical Nurse

## 2024-03-11 ENCOUNTER — Ambulatory Visit: Payer: Self-pay | Admitting: Licensed Practical Nurse

## 2024-03-11 DIAGNOSIS — B9689 Other specified bacterial agents as the cause of diseases classified elsewhere: Secondary | ICD-10-CM

## 2024-03-11 MED ORDER — METRONIDAZOLE 500 MG PO TABS: 500.0000 mg | ORAL_TABLET | Freq: Two times a day (BID) | ORAL | 0 refills | Status: AC

## 2024-03-11 NOTE — Progress Notes (Signed)
 Pt seen for vaginal complaints, swab shows BV and yeast, pt already counseled on yeast. Will treat BV Jinnie Cookey, CNM  [facility] 03/11/2024  10:00 AM

## 2024-03-22 NOTE — Progress Notes (Unsigned)
" ° ° °  Return Prenatal Note   Subjective   24 y.o. G2P0101 at [redacted]w[redacted]d presents for this follow-up prenatal visit.  Patient  Patient reports: has not been feeling well for the last week or so, Appears not well today. Has been  crampy-belly gets tights the pain goes to the back all of the time, nausea-all day, is able to eat, vomits bile when she first wakes, has heartburn,  she drinks 2 Stanley bottles per day, her urine is dark orange Denies changes to her discharge denies diarrhea or fevers.  -she completed treatment for BV, yeast and UTI now on Macrobid  daily  -taking Protonix  BID does not help, TUMS doe snot help  -Promethazine  sometimes helps better than Zofran , last took 4mg  Zofran  Dec 31  -eating chicken sweet potatoes, low fat dairy, eggs no yolk wheat bread, does not eat tomatoes or OJ, following diet for gallbladder disease   Movement: Present Contractions: Irritability  Objective   Flow sheet Vitals: Pulse Rate: (!) 114 BP: 125/87 Fundal Height:  (30.5) Fetal Heart Rate (bpm): 145 Total weight gain: 7 lb 8 oz (3.402 kg)  General Appearance  No acute distress, ill appearing, and well nourished Pulmonary   Normal work of breathing Neurologic   Alert and oriented to person, place, and time Psychiatric   Mood and affect within normal limits Abd: non tender no masses   Assessment/Plan   Plan  25 y.o. G2P0101 at [redacted]w[redacted]d presents for follow-up OB visit. Reviewed prenatal record including previous visit note.  Pyelonephritis affecting pregnancy -Now taking Macrobid  daily for prophylaxis   Supervision of other normal pregnancy, antepartum -TWG 7lbs, has lost a pound since last visit most likely form dietary changes and current symptoms of heartburn and nausea -Zofran  increased to 8mg , may use both Zofran  and Phenergan  -Add famotidine , limit high acid foods, try raw almonds if you can tolerate  -discussed her symptoms could be related to third trimester, however we should rule  out infection swabs and urine collected, encouraged to increase hydration, warm baths. Signs of preterm labor discussed, please go to the hospital if you are concerned for preterm labor -warning signs reviewed  -US  ordered for S<D       Orders Placed This Encounter  Procedures   Urine Culture   US  OB Follow Up    Standing Status:   Future    Expected Date:   03/30/2024    Expiration Date:   03/23/2025    Reason for exam::   S<D    Preferred imaging location?:   Internal   Return in about 2 weeks (around 04/06/2024) for ROB.   Future Appointments  Date Time Provider Department Center  03/31/2024  3:00 PM [facility] US  1 [facility] None  04/06/2024  1:55 PM Leigh Sober, MD [facility] None     For next visit:  continue with routine prenatal care     JINNIE HERO [facility], CNM  1/7/20268:24 AM  "

## 2024-03-23 ENCOUNTER — Ambulatory Visit: Admitting: Licensed Practical Nurse

## 2024-03-23 ENCOUNTER — Other Ambulatory Visit (HOSPITAL_COMMUNITY)
Admission: RE | Admit: 2024-03-23 | Discharge: 2024-03-23 | Disposition: A | Discharge status: Home / Self Care | Source: Ambulatory Visit | Attending: Licensed Practical Nurse | Admitting: Licensed Practical Nurse

## 2024-03-23 ENCOUNTER — Encounter: Payer: Self-pay | Admitting: Licensed Practical Nurse

## 2024-03-23 VITALS — BP 125/87 | HR 114 | Wt 167.5 lb

## 2024-03-23 DIAGNOSIS — O219 Vomiting of pregnancy, unspecified: Secondary | ICD-10-CM

## 2024-03-23 DIAGNOSIS — Z3A33 33 weeks gestation of pregnancy: Secondary | ICD-10-CM | POA: Diagnosis not present

## 2024-03-23 DIAGNOSIS — O26843 Uterine size-date discrepancy, third trimester: Secondary | ICD-10-CM

## 2024-03-23 DIAGNOSIS — O99891 Other specified diseases and conditions complicating pregnancy: Secondary | ICD-10-CM | POA: Diagnosis not present

## 2024-03-23 DIAGNOSIS — R109 Unspecified abdominal pain: Secondary | ICD-10-CM

## 2024-03-23 DIAGNOSIS — N10 Acute tubulo-interstitial nephritis: Secondary | ICD-10-CM | POA: Diagnosis not present

## 2024-03-23 DIAGNOSIS — Z3480 Encounter for supervision of other normal pregnancy, unspecified trimester: Secondary | ICD-10-CM

## 2024-03-23 DIAGNOSIS — Z3483 Encounter for supervision of other normal pregnancy, third trimester: Secondary | ICD-10-CM | POA: Diagnosis present

## 2024-03-23 DIAGNOSIS — Z113 Encounter for screening for infections with a predominantly sexual mode of transmission: Secondary | ICD-10-CM | POA: Diagnosis present

## 2024-03-23 DIAGNOSIS — K8020 Calculus of gallbladder without cholecystitis without obstruction: Secondary | ICD-10-CM

## 2024-03-23 MED ORDER — ONDANSETRON HCL 8 MG PO TABS: 8.0000 mg | ORAL_TABLET | Freq: Three times a day (TID) | ORAL | 0 refills | Status: AC | PRN

## 2024-03-24 NOTE — Assessment & Plan Note (Signed)
-  Now taking Macrobid  daily for prophylaxis

## 2024-03-24 NOTE — Assessment & Plan Note (Addendum)
-  TWG 7lbs, has lost a pound since last visit most likely form dietary changes and current symptoms of heartburn and nausea -Zofran  increased to 8mg , may use both Zofran  and Phenergan  -Add famotidine , limit high acid foods, try raw almonds if you can tolerate  -discussed her symptoms could be related to third trimester, however we should rule out infection swabs and urine collected, encouraged to increase hydration, warm baths. Signs of preterm labor discussed, please go to the hospital if you are concerned for preterm labor -warning signs reviewed  -US  ordered for S<D

## 2024-03-25 LAB — CERVICOVAGINAL ANCILLARY ONLY
Chlamydia: NEGATIVE
Comment: NEGATIVE
Comment: NEGATIVE
Comment: NORMAL
Neisseria Gonorrhea: NEGATIVE
Trichomonas: NEGATIVE

## 2024-03-26 ENCOUNTER — Other Ambulatory Visit: Payer: Self-pay | Admitting: Obstetrics

## 2024-03-26 LAB — URINE CULTURE

## 2024-03-31 ENCOUNTER — Encounter: Payer: Self-pay | Admitting: Licensed Practical Nurse

## 2024-03-31 ENCOUNTER — Ambulatory Visit

## 2024-03-31 DIAGNOSIS — Z3A34 34 weeks gestation of pregnancy: Secondary | ICD-10-CM

## 2024-03-31 DIAGNOSIS — Z3A33 33 weeks gestation of pregnancy: Secondary | ICD-10-CM

## 2024-03-31 DIAGNOSIS — Z3483 Encounter for supervision of other normal pregnancy, third trimester: Secondary | ICD-10-CM

## 2024-03-31 DIAGNOSIS — O26843 Uterine size-date discrepancy, third trimester: Secondary | ICD-10-CM

## 2024-04-01 ENCOUNTER — Other Ambulatory Visit: Payer: Self-pay | Admitting: Licensed Practical Nurse

## 2024-04-01 DIAGNOSIS — Z20828 Contact with and (suspected) exposure to other viral communicable diseases: Secondary | ICD-10-CM

## 2024-04-01 MED ORDER — OSELTAMIVIR PHOSPHATE 75 MG PO CAPS: 75.0000 mg | ORAL_CAPSULE | Freq: Every day | ORAL | 0 refills | Status: AC

## 2024-04-01 NOTE — Progress Notes (Signed)
 Pt's son with Flu, requesting Tamiflu  Script sent Jinnie Cookey, [place]   [facility] 04/01/24  8:41 AM

## 2024-04-05 ENCOUNTER — Emergency Department

## 2024-04-05 ENCOUNTER — Other Ambulatory Visit: Payer: Self-pay

## 2024-04-05 ENCOUNTER — Observation Stay
Admission: EM | Admit: 2024-04-05 | Discharge: 2024-04-05 | Disposition: A | Discharge status: Home / Self Care | Attending: Emergency Medicine | Admitting: Emergency Medicine

## 2024-04-05 ENCOUNTER — Ambulatory Visit: Payer: Self-pay | Admitting: Licensed Practical Nurse

## 2024-04-05 ENCOUNTER — Encounter: Payer: Self-pay | Admitting: *Deleted

## 2024-04-05 DIAGNOSIS — E876 Hypokalemia: Secondary | ICD-10-CM

## 2024-04-05 DIAGNOSIS — O99891 Other specified diseases and conditions complicating pregnancy: Secondary | ICD-10-CM

## 2024-04-05 DIAGNOSIS — R0789 Other chest pain: Secondary | ICD-10-CM | POA: Diagnosis not present

## 2024-04-05 DIAGNOSIS — Z3A35 35 weeks gestation of pregnancy: Secondary | ICD-10-CM | POA: Diagnosis not present

## 2024-04-05 DIAGNOSIS — O99283 Endocrine, nutritional and metabolic diseases complicating pregnancy, third trimester: Secondary | ICD-10-CM | POA: Diagnosis not present

## 2024-04-05 DIAGNOSIS — R1030 Lower abdominal pain, unspecified: Secondary | ICD-10-CM | POA: Diagnosis not present

## 2024-04-05 DIAGNOSIS — O26893 Other specified pregnancy related conditions, third trimester: Principal | ICD-10-CM | POA: Insufficient documentation

## 2024-04-05 DIAGNOSIS — R109 Unspecified abdominal pain: Secondary | ICD-10-CM

## 2024-04-05 DIAGNOSIS — H538 Other visual disturbances: Secondary | ICD-10-CM | POA: Insufficient documentation

## 2024-04-05 DIAGNOSIS — Z3480 Encounter for supervision of other normal pregnancy, unspecified trimester: Secondary | ICD-10-CM

## 2024-04-05 LAB — BASIC METABOLIC PANEL WITH GFR
Anion gap: 12 (ref 5–15)
BUN: <5 mg/dL — ABNORMAL LOW (ref 6–20)
CO2: 22 mmol/L (ref 22–32)
Calcium: 8.8 mg/dL — ABNORMAL LOW (ref 8.9–10.3)
Chloride: 102 mmol/L (ref 98–111)
Creatinine, Ser: 0.37 mg/dL — ABNORMAL LOW (ref 0.44–1.00)
GFR, Estimated: >60 mL/min
Glucose, Bld: 105 mg/dL — ABNORMAL HIGH (ref 70–99)
Potassium: 3.1 mmol/L — ABNORMAL LOW (ref 3.5–5.1)
Sodium: 136 mmol/L (ref 135–145)

## 2024-04-05 LAB — TROPONIN T, HIGH SENSITIVITY: Troponin T High Sensitivity: <15 ng/L (ref 0–19)

## 2024-04-05 LAB — PROTEIN / CREATININE RATIO, URINE
Creatinine, Urine: 60 mg/dL
Protein Creatinine Ratio: 0.2 mg/mg — ABNORMAL HIGH
Total Protein, Urine: 9 mg/dL

## 2024-04-05 LAB — AST: AST: 53 U/L — ABNORMAL HIGH (ref 15–41)

## 2024-04-05 LAB — CBC
HCT: 30.3 % — ABNORMAL LOW (ref 36.0–46.0)
Hemoglobin: 10 g/dL — ABNORMAL LOW (ref 12.0–15.0)
MCH: 28.3 pg (ref 26.0–34.0)
MCHC: 33 g/dL (ref 30.0–36.0)
MCV: 85.8 fL (ref 80.0–100.0)
Platelets: 195 K/uL (ref 150–400)
RBC: 3.53 MIL/uL — ABNORMAL LOW (ref 3.87–5.11)
RDW: 13.4 % (ref 11.5–15.5)
WBC: 11.2 K/uL — ABNORMAL HIGH (ref 4.0–10.5)
nRBC: 0 % (ref 0.0–0.2)

## 2024-04-05 LAB — ALT: ALT: 106 U/L — ABNORMAL HIGH (ref 0–44)

## 2024-04-05 MED ORDER — MORPHINE SULFATE (PF) 2 MG/ML IV SOLN: 2.0000 mg | INTRAVENOUS | Status: DC | PRN

## 2024-04-05 MED ORDER — POTASSIUM CHLORIDE CRYS ER 10 MEQ PO TBCR
10.0000 meq | EXTENDED_RELEASE_TABLET | Freq: Two times a day (BID) | ORAL | Status: DC
  Administered 2024-04-05: 10 meq via ORAL
  Filled 2024-04-05: qty 1

## 2024-04-05 MED ORDER — IOHEXOL 350 MG/ML SOLN
75.0000 mL | Freq: Once | INTRAVENOUS | Status: AC | PRN
  Administered 2024-04-05: 75 mL via INTRAVENOUS

## 2024-04-05 MED ORDER — POTASSIUM CHLORIDE CRYS ER 10 MEQ PO TBCR: 10.0000 meq | EXTENDED_RELEASE_TABLET | Freq: Every day | ORAL | 0 refills | Status: AC

## 2024-04-05 NOTE — Discharge Instructions (Signed)
 Your chest pain workup is reassuring.  There is no sign of a blood clot (pulmonary embolism) and your lung.  Your heart enzyme does not show any sign of strain on the heart and your EKG is reassuring.  Please proceed upstairs to labor and delivery for fetal monitoring, as we discussed.  Return to the ER immediately for new, worsening, or persistent severe chest pain, difficulty breathing, weakness or lightheadedness, or any other new or worsening symptoms that concern you.

## 2024-04-05 NOTE — ED Triage Notes (Signed)
 Pt to triage via wheelchair   pt has right side chest pain and right arm pain.  Pt has upper abd pain.  Pt reports intermittent sob.  No vag bleeding  pt has low abd pressure.  Pt is [redacted] weeks pregnant.  Pt alert   speech clear.

## 2024-04-05 NOTE — OB Triage Note (Signed)
 Pt is a G2P1 at [redacted]w[redacted]d presenting to L/D triage for an NST from the ED.  Pt reports chest pain/upper abd pain that started on Saturday and is ongoing.  She reports upper abd pain is constant, crampy/sore, and tender to touch. She states that she feels pressure in the chest. Pt reports she has had blurry vision here and there for a month or so. Pt denies HA. DTR +2. Absent clonus. Initial BP 122/59.  Monitors applied and assessing with initial FHT 135 at 2201. A Thompson CNM aware of pt arrival to the unit.

## 2024-04-05 NOTE — ED Notes (Signed)
 Pt [redacted]wks pregnant; G2P1; EDC 2/23, pt at [facility]; c/o rt sided CP into rt arm and epigastric area

## 2024-04-05 NOTE — ED Provider Notes (Signed)
 "  [facility] Provider Note    Event Date/Time   First MD Initiated Contact with Patient 04/05/24 2002     (approximate)   History   Chest Pain   HPI  Faith [name] is a 24 y.o. female G2P0101 currently 34 weeks 6 days pregnant who presents with chest pain as well as some lower abdominal pain.  The patient's main complaint is right-sided chest pain which is mostly sharp in quality.  The pain radiates to the right arm.  She has associated shortness of breath.  She denies any cough or fever.  She has no vomiting or diarrhea.  She does feel somewhat lightheaded.  The patient also reports some pain to the right lower rib/right upper quadrant area although she has had this pain before.  She also reports some lower abdominal pain.  She has no vaginal bleeding and no leakage of fluid.  She denies any change in fetal movement.  She denies any acute leg swelling.  I reviewed the past medical records per the patient's most recent outpatient encounter was on 1/6 with OB/GYN for a follow-up prenatal visit.  She was seen in the ED on 12/23 with right upper quadrant abdominal pain.   Physical Exam   Triage Vital Signs: ED Triage Vitals [04/05/24 1937]  Encounter Vitals Group     BP 114/89     Girls Systolic BP Percentile      Girls Diastolic BP Percentile      Boys Systolic BP Percentile      Boys Diastolic BP Percentile      Pulse Rate (!) 123     Resp (!) 22     Temp 98.5 F (36.9 C)     Temp Source Oral     SpO2 99 %     Weight 176 lb (79.8 kg)     Height 5' 4 (1.626 m)     Head Circumference      Peak Flow      Pain Score 8     Pain Loc      Pain Education      Exclude from Growth Chart     Most recent vital signs: Vitals:   04/05/24 1937 04/05/24 2100  BP: 114/89 125/66  Pulse: (!) 123 (!) 108  Resp: (!) 22 20  Temp: 98.5 F (36.9 C)   SpO2: 99% 99%    General: Alert, well-appearing, no distress.  CV:  Good peripheral perfusion.  Tachycardic,  otherwise normal heart sounds. Resp:  Normal effort.  Lungs CTAB. Abd:  Gravid abdomen.  Soft with no focal tenderness.  No distention.  Other:  No peripheral edema.  No calf or popliteal swelling or tenderness.   ED Results / Procedures / Treatments   Labs (all labs ordered are listed, but only abnormal results are displayed) Labs Reviewed  BASIC METABOLIC PANEL WITH GFR - Abnormal; Notable for the following components:      Result Value   Potassium 3.1 (*)    Glucose, Bld 105 (*)    BUN <5 (*)    Creatinine, Ser 0.37 (*)    Calcium  8.8 (*)    All other components within normal limits  CBC - Abnormal; Notable for the following components:   WBC 11.2 (*)    RBC 3.53 (*)    Hemoglobin 10.0 (*)    HCT 30.3 (*)    All other components within normal limits  TROPONIN T, HIGH SENSITIVITY     EKG  ED ECG REPORT I, Waylon Cassis, the attending physician, personally viewed and interpreted this ECG.  Date: 04/05/2024 EKG Time: 1940 Rate: 113 Rhythm: Sinus tachycardia QRS Axis: normal Intervals: normal ST/T Wave abnormalities: normal Narrative Interpretation: no evidence of acute ischemia    RADIOLOGY  CT angio chest:  IMPRESSION:  1. No pulmonary embolism or acute pulmonary abnormality.    PROCEDURES:  Critical Care performed: No  Procedures   MEDICATIONS ORDERED IN ED: Medications  iohexol  (OMNIPAQUE ) 350 MG/ML injection 75 mL (75 mLs Intravenous Contrast Given 04/05/24 2104)     IMPRESSION / MDM / ASSESSMENT AND PLAN / ED COURSE  I reviewed the triage vital signs and the nursing notes.  24 year old female with PMH as noted above currently almost [redacted] weeks pregnant presents with atypical chest pain since earlier today as well as some lower abdominal pain.  The patient also notes some right upper quadrant abdominal pain that is not new today and that she has been evaluated for before.  On exam the patient is tachycardic.  Her other vital signs are normal.   O2 saturations in the high 90s on room air.  Lungs are clear to auscultation.  There is no significant abdominal tenderness.  Differential diagnosis includes, but is not limited to, GERD, musculoskeletal pain, less likely PE.  I have a low suspicion for ACS.  There is no evidence of vascular etiology.  Initial troponin is negative.  Given the duration of the symptoms and the patient's low ACS risk, there is no indication for serial enzymes.  BMP shows no acute findings.  CBC is also unremarkable except for anemia which is chronic.  Given the patient's tachycardia and report of shortness of breath and lightheadedness, I feel that she is at higher risk for PE.  Given her advanced pregnancy she is not a good candidate for rule out with a D-dimer.  Therefore, based on shared decision making with the patient, we will obtain a CTA to rule out PE.  Patient's presentation is most consistent with acute complicated illness / injury requiring diagnostic workup.  The patient is on the cardiac monitor to evaluate for evidence of arrhythmia and/or significant heart rate changes.   ----------------------------------------- 9:25 PM on 04/05/2024 -----------------------------------------  CTA is negative.  The patient's tachycardia is resolved.  Her heart rate was just around 100 when I went to reassess her.  She is feeling better.  At this time there is no indication for further workup for her chest pain.  Her blood pressure is normal.  The patient will need to go to L&D for fetal monitoring.  I consulted and discussed the case with CNM Delvecchio Madole from [facility] who agrees with the plan and is aware of the patient going upstairs.  I counseled the patient on the results of the workup and plan of care.  I answered all of her questions.  She is in agreement with the plan.  I gave strict return precautions, and she expressed understanding.   FINAL CLINICAL IMPRESSION(S) / ED DIAGNOSES   Final diagnoses:   Atypical chest pain  Lower abdominal pain     Rx / DC Orders   ED Discharge Orders     None        Note:  This document was prepared using Dragon voice recognition software and may include unintentional dictation errors.    Cassis Waylon, MD 04/05/24 2133  "

## 2024-04-05 NOTE — OB Triage Provider Note (Addendum)
 "      L&D OB Triage Note  SUBJECTIVE Faith [name] is a 24 y.o. G40P0101 female at [redacted]w[redacted]d, EDD Estimated Date of Delivery: 05/10/24 who presented to triage from the ED after evaluation of chest pain and abdominal pain.  She notes pain in her upper abdomen on arrival to L&D . She has history of gallstones with this pregnancy. She state she has had this pain since Saturday but it has gotten worse today. She also c/o of blurred vision from time to time. She denies epigastric pain and headache. She is feeling good fetal movement, denies contractions, loss of fluid, and vaginal bleeding.   OB History  Gravida Para Term Preterm AB Living  2 1 0 1 0 1  SAB IAB Ectopic Multiple Live Births  0 0 0 0 1    # Outcome Date GA Lbr Len/2nd Weight Sex Type Anes PTL Lv  2 Current           1 Preterm 11/25/21 [redacted]w[redacted]d  2240 g M Vag-Spont EPI Y LIV     Complications: Pre-eclampsia     Name: Dobberstein,BB   Jasamine ADELINE     Apgar1: 6  Apgar5: 7    Medications Prior to Admission  Medication Sig Dispense Refill Last Dose/Taking   aspirin  EC 81 MG tablet Take 1 tablet (81 mg total) by mouth daily. Take after 12 weeks for prevention of preeclampssia later in pregnancy 300 tablet 2 Past Week   famotidine  (PEPCID ) 20 MG tablet TAKE 1 TABLET BY MOUTH TWICE A DAY 180 tablet 1 Past Month   nitrofurantoin , macrocrystal-monohydrate, (MACROBID ) 100 MG capsule Take 1 capsule (100 mg total) by mouth at bedtime. 90 capsule 1 Past Week   oseltamivir  (TAMIFLU ) 75 MG capsule Take 1 capsule (75 mg total) by mouth daily for 7 days. 7 capsule 0 Past Week   DICLEGIS 10-10 MG TBEC Take 2 tablets by mouth at bedtime. (Patient not taking: Reported on 02/18/2024)      EPINEPHrine  0.3 mg/0.3 mL IJ SOAJ injection Inject 0.3 mg into the muscle as needed for anaphylaxis. (Patient not taking: Reported on 04/05/2024) 1 each 10 Not Taking   metroNIDAZOLE  (FLAGYL ) 500 MG tablet Take 1 tablet (500 mg total) by mouth 2 (two) times daily. (Patient  not taking: Reported on 04/05/2024) 14 tablet 0 Not Taking   ondansetron  (ZOFRAN ) 8 MG tablet Take 1 tablet (8 mg total) by mouth every 8 (eight) hours as needed for nausea or vomiting. (Patient not taking: Reported on 04/05/2024) 20 tablet 0 Not Taking   pantoprazole  (PROTONIX ) 40 MG tablet TAKE 1 TABLET BY MOUTH EVERY DAY (Patient not taking: Reported on 04/05/2024) 90 tablet 0 Not Taking   Prenatal Vit-Fe Fumarate-FA (PRENATAL PO) Take 2 each by mouth daily. Taking 2 gummies daily.      promethazine  (PHENERGAN ) 25 MG suppository Place 1 suppository (25 mg total) rectally every 6 (six) hours as needed for nausea or vomiting. (Patient not taking: Reported on 12/23/2023) 12 each 4 Not Taking   promethazine  (PHENERGAN ) 25 MG tablet Take 1 tablet (25 mg total) by mouth every 6 (six) hours as needed for nausea or vomiting. (Patient not taking: Reported on 04/05/2024) 30 tablet 1 Not Taking     OBJECTIVE  Nursing Evaluation:   BP (!) 122/59   Pulse (!) 111   Temp 98.2 F (36.8 C)   Resp 20   Ht 5' 4 (1.626 m)   Wt 79.8 kg   LMP 08/04/2023  SpO2 99%   BMI 30.21 kg/m    Findings:   Reactive NST, no contractions      NST was performed and has been reviewed by me.  NST INTERPRETATION: Category I  Mode: External Baseline Rate (A): 135 bpm Variability: Moderate Accelerations: 15 x 15 Decelerations: None     Contraction Frequency (min): none noted; UI  ASSESSMENT Impression:  1.  Pregnancy:  G2P0101 at [redacted]w[redacted]d , EDD Estimated Date of Delivery: 05/10/24 2.  Reassuring fetal and maternal status. Category 1 strip 3.  Abdominal pain : history gallstones this pregnancy with elevated AST/ALT. Pt decline IV pain medication 4. Episodes blurred vision: p/c ratio 200, AST/ALT remain elevated due to gall stones.  5. Hypokalemia: oral potassium given.   Blood pressure (!) 122/59, pulse (!) 111, temperature 98.2 F (36.8 C), resp. rate 20, height 5' 4 (1.626 m), weight 79.8 kg, last menstrual  period 08/04/2023, SpO2 99%.   Protein / creatinine ratio, urine     Component Ref Range & Units (hover) 22:40 1 mo ago  Creatinine, Urine 60 104 CM  Comment: NO NORMAL RANGE ESTABLISHED FOR THIS TEST  Total Protein, Urine 9 24 CM  Comment: NO NORMAL RANGE ESTABLISHED FOR THIS TEST  Protein Creatinine Ratio 0.2 High  0.23 High  R, CM  Comment: Please note change in reference range.     ALT 106  AST 53     Latest Ref Rng & Units 04/05/2024    7:40 PM 03/09/2024    5:00 PM 03/03/2024    5:02 AM  BMP  Glucose 70 - 99 mg/dL 894  82  884   BUN 6 - 20 mg/dL <5  7  <5   Creatinine 0.44 - 1.00 mg/dL 9.62  9.65  9.66   Sodium 135 - 145 mmol/L 136  136  136   Potassium 3.5 - 5.1 mmol/L 3.1  3.4  3.8   Chloride 98 - 111 mmol/L 102  102  105   CO2 22 - 32 mmol/L 22  19  22    Calcium  8.9 - 10.3 mg/dL 8.8  8.9  8.4      PLAN 1. Current condition and above findings reviewed.  Reassuring fetal and maternal condition. Continue potassium supplement , repeat BMP 1 wk in the office.  2. Discharge home with standard labor and pre eclampsia precautions given to return to L&D or call the office for problems. 3. Continue routine prenatal care.    Follow up as scheduled in office tomorrow with Dr. Leigh.   Zelda Hummer, CNM  "

## 2024-04-05 NOTE — OB Triage Note (Signed)
 Pt is being discharged home per provider order. Pt aware of new prescription for potassium and verbalizes understanding. Discharge instructions provided and reviewed with pt. Pt instructed to return if experiencing LOF, VB, CTX, decreased FM. Pt discharged home stable and ambulatory with significant other.

## 2024-04-06 ENCOUNTER — Encounter: Admitting: Obstetrics

## 2024-04-07 ENCOUNTER — Encounter: Admitting: Certified Nurse Midwife

## 2024-04-12 NOTE — Discharge Summary (Signed)
 ------------------------------------------------------------------------------- Attestation signed by Tory Gell, Hadassah Gab, MD at 04/12/24 1108 Attending Attestation:    I agree with the assessment and plan as outlined by Dr. Mevelyn. I have seen and evaluated this patient, have discussed with the resident all elements of the postpartum course, inclusive of the history and physical exam, and was involved with arriving at the diagnosis and in the development of the treatment plan.  Pt PPD#3. Doing well and meeting milestones on routine pathway orders. C/b PreE w SF for which her LFTs have been elevated. Rpt LFTs this morning are downtrending but also consistent with her elevated LFTs since the first trimester. She denies any symptoms of PEC.  She will received Rhogam before going home.   Gab Tory Gell MD  04/12/2024 -------------------------------------------------------------------------------  Obstetrics Discharge Summary  Admit Date: 04/07/2024  Discharge Date and Time: 04/12/2024   Discharge To: Home  Discharge Provider: Orlene Mevelyn, MD  Delivery Type: Vaginal, Spontaneous  Gestational Age at Delivery:  Information for the patient's newborn:  [name], BB   [name] [[id number]]  Gestational Age: [redacted]w[redacted]d  Delivery Clinician: SOCORRO ROSLYNN CROME  Delivery Anesthesia: Epidural  Labor Complications: Other Excessive Bleeding  Postpartum Complications: None  Hospital Problems Addressed During this Hospitalization: Active Problems:   * No active hospital problems. *  Birth Control Planned at Discharge: will follow up at [facility] visit  Infant Feeding Method at Discharge: L&D Feeding Options: breast  O NEG Infant is O+, will give Rhogam  Hospital Course:  Faith [name] is a 24 y.o. now G2P0101 who was admitted to labor and delivery on 04/07/2024 for pre-eclampsia with severe features by headache and LFTs.  Her labor course was uncomplicated. She delivered via  spontaneous vaginal delivery at [redacted]w[redacted]d on 04/10/24 with no complications.  Her pregnancy was complicated by the following:  Pre-E with severe features by HA/LFTs: - HELLP labs notable for AST/ALT: 50/95 > 62/101 > 33/71 at the time of discharge. The patient had transaminitis throughout pregnancy as early as 8 weeks. She was started on Unclear etiology but LFTs are similar to baseline at start of pregnancy by the time of discharge. Hepatitis panel negative on admit. Her blood pressures were normotensive to mild range throughout her admission. She received her 24-hr magnesium postpartum. She was started on a 5-day lasix 20 mg course on 1/25.   Her postpartum course was uncomplicated. During the postpartum period she met all her postpartum goals including pain control by po medications, limited vaginal bleeding, taking good food and drink, ambulating well, and no problems urinating or passing flatus. She was discharged home on PPD#2 in stable condition. She was counseled on routine postpartum care and to follow-up with her provider.   No  interpreter was used for this encounter.  Day of Discharge Services: Patient was seen and examined by the primary team and deemed stable for discharge.  BP 119/80   Pulse 86   Temp 36.6 C (97.9 F) (Oral)   Resp 16   Ht 162.6 cm (5' 4)   Wt 78 kg (172 lb)   LMP 08/04/2023   SpO2 99%   Breastfeeding Yes   BMI 29.52 kg/m  General: No acute distress Respiratory: Normal work of breathing Abdomen: soft, non-distended, fundus firm below the umbilicus, nontender to palpation GU: minimal vaginal bleeding Extremities: trace edema bilaterally  Condition at Discharge: good  Pertinent Labs: HELLP Results in Past 30 Days Result Component Current Result Ref Range Previous Result Ref Range  ALT 71 (H) (  04/12/2024) 10 - 49 U/L 101 (H) (04/11/2024) 10 - 49 U/L  AST 33 (04/12/2024) <=34 U/L 62 (H) (04/11/2024) <=34 U/L  Protein/Creatinine Ratio, Urine 0.323 (04/07/2024)  Undefined Not in Time Range   [  CBC Results in Past 30 Days Result Component Current Result Ref Range Previous Result Ref Range  HCT 28.6 (L) (04/11/2024) 34.0 - 44.0 % 29.4 (L) (04/09/2024) 34.0 - 44.0 %  HGB 9.5 (L) (04/11/2024) 11.3 - 14.9 g/dL 89.9 (L) (8/76/7973) 88.6 - 14.9 g/dL  MCH 72.5 (8/74/7973) 74.0 - 32.4 pg 27.9 (04/09/2024) 25.9 - 32.4 pg  MCHC 33.3 (04/11/2024) 32.0 - 36.0 g/dL 66.0 (8/76/7973) 67.9 - 36.0 g/dL  MCV 17.8 (8/74/7973) 22.3 - 95.7 fL 82.4 (04/09/2024) 77.6 - 95.7 fL  Platelet 187 (04/11/2024) 150 - 450 10*9/L 182 (04/09/2024) 150 - 450 10*9/L  RBC 3.48 (L) (04/11/2024) 3.95 - 5.13 10*12/L 3.57 (L) (04/09/2024) 3.95 - 5.13 10*12/L  RDW 13.8 (04/11/2024) 12.2 - 15.2 % 13.3 (04/09/2024) 12.2 - 15.2 %  WBC 12.2 (H) (04/11/2024) 3.6 - 11.2 10*9/L 8.7 (04/09/2024) 3.6 - 11.2 10*9/L    Pending Test Results:   Discharge Medications:    Your Medication List     ASK your doctor about these medications    aspirin  81 MG tablet Commonly known as: ECOTRIN Take 1 tablet (81 mg total) by mouth daily.   hydrOXYzine 25 MG tablet Commonly known as: ATARAX Take 1 tablet (25 mg total) by mouth Three (3) times a day.   nitrofurantoin  (macrocrystal-monohydrate) 100 MG capsule Commonly known as: MACROBID  Take 1 capsule (100 mg total) by mouth daily.   pantoprazole  40 MG tablet Commonly known as: Protonix  Take 1 tablet (40 mg total) by mouth daily before breakfast.   promethazine  25 MG tablet Commonly known as: PHENERGAN  Take 1 tablet (25 mg total) by mouth every six (6) hours as needed for nausea.         Immunizations: Immunization History  Administered Date(s) Administered   COVID-19 VACCINE,MRNA(MODERNA)(PF) 12/11/2019, 01/09/2020   TdaP 10/23/2021    Discharge Instructions: For NON-URGENT medical issues, please contact your pregnancy care provider during business hours or send a MyChart message. If you cannot reach your pregnancy health care provider call the [facility]  OB/GYN clinic at [phone] during business hours (8-4:00 pm).   For URGENT medical issues after hours, weekends, or holidays, please call the [facility] hospital operator at [phone] or [phone] and ask for the on-call OB provider.  Follow-up care: After birth you should be seen by your pregnancy health care provider within 4 to 6 weeks. You may need to be seen sooner if your provider tells you to (for example, in 1 week for blood pressure check, 2 weeks for mood check). See your discharge summary for specific follow-up.  Warning Signs/When to call your provider  When should you call for help?  Call 911 anytime you think you may need emergency care. For example, call if:  You have thoughts of harming yourself, your baby, or another person  You have chest pain, are short of breath, or cough up blood  You have a seizure  Call your health care provider now or seek immediate medical care if:  You have a fever of 100.26F or higher  You are soaking through a pad each hour for 2 or more hours  You are dizzy or lightheaded, or you feel like you may faint  Your pain is getting worse or you need more pain medicine than the day before.  You have signs of a blood clot in your leg (called a deep vein thrombosis), such as:  Pain in the calf, back of the knee, thigh, or groin  Redness and swelling in your leg or groin  You have signs of breast infection: redness or fevers You have signs of postpartum preeclampsia, such as:  Elevated blood pressure systolic > 160 (top number) or diastolic > 110 (bottom number) New vision problems (such as blurriness or seeing spots) A bad headache that does not go away with medicine  If you had stitches, these should dissolve on their own.  Nothing should be placed in the vagina for up to 6 weeks: refrain from intercourse or using tampons.  You can become pregnant even before your first period.  Be sure to use birth control if you have intercourse. If you had a  C-section and:  The skin around your incision is red  Your incision opens  There is blood or any fluid is coming out of your incision  Difficulty with bowel movements   Orlene Antigua, MD 04/12/24  9:27 AM

## 2024-05-10 ENCOUNTER — Inpatient Hospital Stay: Admit: 2024-05-10
